# Patient Record
Sex: Female | Born: 1950 | Race: White | Hispanic: No | Marital: Single | Smoking: Former smoker
Health system: Southern US, Community
[De-identification: ages and names within clinical notes are randomized; demographics above are authoritative.]

## PROBLEM LIST (undated history)

## (undated) DIAGNOSIS — J449 Chronic obstructive pulmonary disease, unspecified: Secondary | ICD-10-CM

## (undated) DIAGNOSIS — I739 Peripheral vascular disease, unspecified: Secondary | ICD-10-CM

## (undated) DIAGNOSIS — Z955 Presence of coronary angioplasty implant and graft: Secondary | ICD-10-CM

## (undated) HISTORY — PX: CARDIAC SURGERY: SHX584

---

## 2019-01-30 ENCOUNTER — Emergency Department (HOSPITAL_COMMUNITY): Payer: Medicare Other

## 2019-01-30 ENCOUNTER — Inpatient Hospital Stay (HOSPITAL_COMMUNITY): Payer: Medicare Other | Admitting: Anesthesiology

## 2019-01-30 ENCOUNTER — Inpatient Hospital Stay (HOSPITAL_COMMUNITY): Payer: Medicare Other

## 2019-01-30 ENCOUNTER — Other Ambulatory Visit: Payer: Self-pay

## 2019-01-30 ENCOUNTER — Encounter (HOSPITAL_COMMUNITY)
Admission: EM | Disposition: A | Discharge status: Home Health | Payer: Self-pay | Source: Home / Self Care | Attending: Internal Medicine

## 2019-01-30 ENCOUNTER — Inpatient Hospital Stay (HOSPITAL_COMMUNITY)
Admission: EM | Admit: 2019-01-30 | Discharge: 2019-02-03 | DRG: 493 | Disposition: A | Discharge status: Home Health | Payer: Medicare Other | Attending: Internal Medicine | Admitting: Internal Medicine

## 2019-01-30 ENCOUNTER — Encounter (HOSPITAL_COMMUNITY): Payer: Self-pay | Admitting: Emergency Medicine

## 2019-01-30 DIAGNOSIS — J449 Chronic obstructive pulmonary disease, unspecified: Secondary | ICD-10-CM | POA: Diagnosis present

## 2019-01-30 DIAGNOSIS — G4733 Obstructive sleep apnea (adult) (pediatric): Secondary | ICD-10-CM

## 2019-01-30 DIAGNOSIS — Z7983 Long term (current) use of bisphosphonates: Secondary | ICD-10-CM

## 2019-01-30 DIAGNOSIS — S82841A Displaced bimalleolar fracture of right lower leg, initial encounter for closed fracture: Secondary | ICD-10-CM

## 2019-01-30 DIAGNOSIS — K5090 Crohn's disease, unspecified, without complications: Secondary | ICD-10-CM | POA: Diagnosis present

## 2019-01-30 DIAGNOSIS — Z20828 Contact with and (suspected) exposure to other viral communicable diseases: Secondary | ICD-10-CM | POA: Diagnosis present

## 2019-01-30 DIAGNOSIS — I119 Hypertensive heart disease without heart failure: Secondary | ICD-10-CM | POA: Diagnosis present

## 2019-01-30 DIAGNOSIS — Z7901 Long term (current) use of anticoagulants: Secondary | ICD-10-CM

## 2019-01-30 DIAGNOSIS — K50919 Crohn's disease, unspecified, with unspecified complications: Secondary | ICD-10-CM

## 2019-01-30 DIAGNOSIS — Z79899 Other long term (current) drug therapy: Secondary | ICD-10-CM

## 2019-01-30 DIAGNOSIS — J439 Emphysema, unspecified: Secondary | ICD-10-CM | POA: Diagnosis not present

## 2019-01-30 DIAGNOSIS — Z9989 Dependence on other enabling machines and devices: Secondary | ICD-10-CM | POA: Diagnosis not present

## 2019-01-30 DIAGNOSIS — Z955 Presence of coronary angioplasty implant and graft: Secondary | ICD-10-CM | POA: Diagnosis not present

## 2019-01-30 DIAGNOSIS — Z87828 Personal history of other (healed) physical injury and trauma: Secondary | ICD-10-CM | POA: Diagnosis not present

## 2019-01-30 DIAGNOSIS — K219 Gastro-esophageal reflux disease without esophagitis: Secondary | ICD-10-CM

## 2019-01-30 DIAGNOSIS — Y92009 Unspecified place in unspecified non-institutional (private) residence as the place of occurrence of the external cause: Secondary | ICD-10-CM

## 2019-01-30 DIAGNOSIS — Z95828 Presence of other vascular implants and grafts: Secondary | ICD-10-CM

## 2019-01-30 DIAGNOSIS — I2510 Atherosclerotic heart disease of native coronary artery without angina pectoris: Secondary | ICD-10-CM

## 2019-01-30 DIAGNOSIS — Z419 Encounter for procedure for purposes other than remedying health state, unspecified: Secondary | ICD-10-CM

## 2019-01-30 DIAGNOSIS — Z8673 Personal history of transient ischemic attack (TIA), and cerebral infarction without residual deficits: Secondary | ICD-10-CM | POA: Diagnosis not present

## 2019-01-30 DIAGNOSIS — W109XXA Fall (on) (from) unspecified stairs and steps, initial encounter: Secondary | ICD-10-CM | POA: Diagnosis present

## 2019-01-30 DIAGNOSIS — S82891A Other fracture of right lower leg, initial encounter for closed fracture: Secondary | ICD-10-CM | POA: Diagnosis not present

## 2019-01-30 DIAGNOSIS — E785 Hyperlipidemia, unspecified: Secondary | ICD-10-CM | POA: Diagnosis present

## 2019-01-30 DIAGNOSIS — I639 Cerebral infarction, unspecified: Secondary | ICD-10-CM | POA: Diagnosis not present

## 2019-01-30 DIAGNOSIS — Z7902 Long term (current) use of antithrombotics/antiplatelets: Secondary | ICD-10-CM

## 2019-01-30 DIAGNOSIS — I739 Peripheral vascular disease, unspecified: Secondary | ICD-10-CM

## 2019-01-30 DIAGNOSIS — M25571 Pain in right ankle and joints of right foot: Secondary | ICD-10-CM | POA: Diagnosis present

## 2019-01-30 DIAGNOSIS — S82851A Displaced trimalleolar fracture of right lower leg, initial encounter for closed fracture: Principal | ICD-10-CM | POA: Diagnosis present

## 2019-01-30 DIAGNOSIS — I252 Old myocardial infarction: Secondary | ICD-10-CM | POA: Diagnosis not present

## 2019-01-30 HISTORY — DX: Peripheral vascular disease, unspecified: I73.9

## 2019-01-30 HISTORY — DX: Chronic obstructive pulmonary disease, unspecified: J44.9

## 2019-01-30 HISTORY — PX: ORIF ANKLE FRACTURE: SHX5408

## 2019-01-30 HISTORY — DX: Presence of coronary angioplasty implant and graft: Z95.5

## 2019-01-30 LAB — CBC WITH DIFFERENTIAL/PLATELET
Abs Immature Granulocytes: 0.03 10*3/uL (ref 0.00–0.07)
Basophils Absolute: 0 10*3/uL (ref 0.0–0.1)
Basophils Relative: 1 %
Eosinophils Absolute: 0.1 10*3/uL (ref 0.0–0.5)
Eosinophils Relative: 1 %
HCT: 34.1 % — ABNORMAL LOW (ref 36.0–46.0)
Hemoglobin: 10.6 g/dL — ABNORMAL LOW (ref 12.0–15.0)
Immature Granulocytes: 1 %
Lymphocytes Relative: 11 %
Lymphs Abs: 0.6 10*3/uL — ABNORMAL LOW (ref 0.7–4.0)
MCH: 32.8 pg (ref 26.0–34.0)
MCHC: 31.1 g/dL (ref 30.0–36.0)
MCV: 105.6 fL — ABNORMAL HIGH (ref 80.0–100.0)
Monocytes Absolute: 0.5 10*3/uL (ref 0.1–1.0)
Monocytes Relative: 8 %
Neutro Abs: 4.3 10*3/uL (ref 1.7–7.7)
Neutrophils Relative %: 78 %
Platelets: 202 10*3/uL (ref 150–400)
RBC: 3.23 MIL/uL — ABNORMAL LOW (ref 3.87–5.11)
RDW: 14.6 % (ref 11.5–15.5)
WBC: 5.5 10*3/uL (ref 4.0–10.5)
nRBC: 0 % (ref 0.0–0.2)

## 2019-01-30 LAB — BASIC METABOLIC PANEL
Anion gap: 9 (ref 5–15)
BUN: 15 mg/dL (ref 8–23)
CO2: 22 mmol/L (ref 22–32)
Calcium: 8.2 mg/dL — ABNORMAL LOW (ref 8.9–10.3)
Chloride: 113 mmol/L — ABNORMAL HIGH (ref 98–111)
Creatinine, Ser: 0.73 mg/dL (ref 0.44–1.00)
GFR calc Af Amer: >60 mL/min (ref >60)
GFR calc non Af Amer: >60 mL/min (ref >60)
Glucose, Bld: 93 mg/dL (ref 70–99)
Potassium: 3.9 mmol/L (ref 3.5–5.1)
Sodium: 144 mmol/L (ref 135–145)

## 2019-01-30 LAB — SURGICAL PCR SCREEN
MRSA, PCR: NEGATIVE
Staphylococcus aureus: NEGATIVE

## 2019-01-30 LAB — SARS CORONAVIRUS 2 BY RT PCR (HOSPITAL ORDER, PERFORMED IN ~~LOC~~ HOSPITAL LAB): SARS Coronavirus 2: NEGATIVE

## 2019-01-30 SURGERY — OPEN REDUCTION INTERNAL FIXATION (ORIF) ANKLE FRACTURE
Anesthesia: General | Site: Ankle | Laterality: Right

## 2019-01-30 MED ORDER — SODIUM CHLORIDE 0.9 % IR SOLN
Status: DC | PRN
  Administered 2019-01-30: 1000 mL

## 2019-01-30 MED ORDER — ONDANSETRON HCL 4 MG/2ML IJ SOLN
4.0000 mg | Freq: Four times a day (QID) | INTRAMUSCULAR | Status: DC | PRN
  Administered 2019-02-01: 4 mg via INTRAVENOUS
  Filled 2019-01-30: qty 2

## 2019-01-30 MED ORDER — OXYCODONE HCL 5 MG/5ML PO SOLN: 5.0000 mg | Freq: Once | ORAL | Status: DC | PRN

## 2019-01-30 MED ORDER — ONDANSETRON HCL 4 MG PO TABS: 4.0000 mg | ORAL_TABLET | Freq: Four times a day (QID) | ORAL | Status: DC | PRN

## 2019-01-30 MED ORDER — CLOPIDOGREL BISULFATE 75 MG PO TABS
75.0000 mg | ORAL_TABLET | Freq: Every day | ORAL | Status: DC
  Administered 2019-01-31 – 2019-02-03 (×4): 75 mg via ORAL
  Filled 2019-01-30 (×4): qty 1

## 2019-01-30 MED ORDER — CEFAZOLIN SODIUM-DEXTROSE 2-4 GM/100ML-% IV SOLN
2.0000 g | Freq: Four times a day (QID) | INTRAVENOUS | Status: AC
  Administered 2019-01-31 (×2): 2 g via INTRAVENOUS
  Filled 2019-01-30 (×2): qty 100

## 2019-01-30 MED ORDER — HYDROCODONE-ACETAMINOPHEN 7.5-325 MG PO TABS
1.0000 | ORAL_TABLET | Freq: Four times a day (QID) | ORAL | Status: DC | PRN
  Administered 2019-01-31 (×3): 1 via ORAL
  Filled 2019-01-30 (×4): qty 1

## 2019-01-30 MED ORDER — MIDAZOLAM HCL 2 MG/2ML IJ SOLN
INTRAMUSCULAR | Status: AC
  Filled 2019-01-30: qty 2

## 2019-01-30 MED ORDER — ONDANSETRON HCL 4 MG/2ML IJ SOLN: 4.0000 mg | Freq: Four times a day (QID) | INTRAMUSCULAR | Status: DC | PRN

## 2019-01-30 MED ORDER — TRAMADOL HCL 50 MG PO TABS: 50.0000 mg | ORAL_TABLET | Freq: Four times a day (QID) | ORAL | 0 refills | Status: DC | PRN

## 2019-01-30 MED ORDER — MENTHOL 3 MG MT LOZG: 1.0000 | LOZENGE | OROMUCOSAL | Status: DC | PRN

## 2019-01-30 MED ORDER — ONDANSETRON HCL 4 MG/2ML IJ SOLN
INTRAMUSCULAR | Status: DC | PRN
  Administered 2019-01-30: 4 mg via INTRAVENOUS

## 2019-01-30 MED ORDER — POLYETHYLENE GLYCOL 3350 17 G PO PACK: 17.0000 g | PACK | Freq: Every day | ORAL | Status: DC | PRN

## 2019-01-30 MED ORDER — ONDANSETRON HCL 4 MG/2ML IJ SOLN
4.0000 mg | Freq: Once | INTRAMUSCULAR | Status: AC
  Administered 2019-01-30: 4 mg via INTRAVENOUS
  Filled 2019-01-30: qty 2

## 2019-01-30 MED ORDER — PANTOPRAZOLE SODIUM 40 MG PO TBEC
40.0000 mg | DELAYED_RELEASE_TABLET | Freq: Two times a day (BID) | ORAL | Status: DC
  Administered 2019-01-31 – 2019-02-03 (×7): 40 mg via ORAL
  Filled 2019-01-30 (×7): qty 1

## 2019-01-30 MED ORDER — PROPOFOL 10 MG/ML IV BOLUS
INTRAVENOUS | Status: AC
  Filled 2019-01-30: qty 20

## 2019-01-30 MED ORDER — DEXAMETHASONE SODIUM PHOSPHATE 10 MG/ML IJ SOLN
INTRAMUSCULAR | Status: DC | PRN
  Administered 2019-01-30: 5 mg via INTRAVENOUS

## 2019-01-30 MED ORDER — MAGNESIUM CITRATE PO SOLN: 1.0000 | Freq: Once | ORAL | Status: DC | PRN

## 2019-01-30 MED ORDER — CHLORHEXIDINE GLUCONATE 4 % EX LIQD
60.0000 mL | Freq: Once | CUTANEOUS | Status: AC
  Administered 2019-01-30: 4 via TOPICAL

## 2019-01-30 MED ORDER — BISACODYL 10 MG RE SUPP: 10.0000 mg | Freq: Every day | RECTAL | Status: DC | PRN

## 2019-01-30 MED ORDER — FENTANYL CITRATE (PF) 100 MCG/2ML IJ SOLN
50.0000 ug | INTRAMUSCULAR | Status: AC
  Administered 2019-01-30: 19:00:00 75 ug via INTRAVENOUS

## 2019-01-30 MED ORDER — ROPIVACAINE HCL 5 MG/ML IJ SOLN
INTRAMUSCULAR | Status: DC | PRN
  Administered 2019-01-30: 25 mL via PERINEURAL

## 2019-01-30 MED ORDER — ONDANSETRON 4 MG PO TBDP
4.0000 mg | ORAL_TABLET | Freq: Three times a day (TID) | ORAL | Status: DC | PRN
  Administered 2019-01-31: 4 mg via ORAL
  Filled 2019-01-30: qty 1

## 2019-01-30 MED ORDER — PROPOFOL 10 MG/ML IV BOLUS
0.5000 mg/kg | Freq: Once | INTRAVENOUS | Status: AC
  Administered 2019-01-30: 15:00:00 40 mg via INTRAVENOUS
  Filled 2019-01-30: qty 20

## 2019-01-30 MED ORDER — METOCLOPRAMIDE HCL 5 MG/ML IJ SOLN: 5.0000 mg | Freq: Three times a day (TID) | INTRAMUSCULAR | Status: DC | PRN

## 2019-01-30 MED ORDER — MORPHINE SULFATE (PF) 2 MG/ML IV SOLN
0.5000 mg | INTRAVENOUS | Status: DC | PRN
  Administered 2019-01-31 (×2): 1 mg via INTRAVENOUS
  Filled 2019-01-30 (×2): qty 1

## 2019-01-30 MED ORDER — FENTANYL CITRATE (PF) 100 MCG/2ML IJ SOLN
INTRAMUSCULAR | Status: DC | PRN
  Administered 2019-01-30: 25 ug via INTRAVENOUS

## 2019-01-30 MED ORDER — FENTANYL CITRATE (PF) 100 MCG/2ML IJ SOLN
INTRAMUSCULAR | Status: AC
  Administered 2019-01-30: 75 ug via INTRAVENOUS
  Filled 2019-01-30: qty 2

## 2019-01-30 MED ORDER — ASPIRIN EC 81 MG PO TBEC: 81.0000 mg | DELAYED_RELEASE_TABLET | Freq: Two times a day (BID) | ORAL | 0 refills | Status: DC

## 2019-01-30 MED ORDER — BUDESONIDE 0.5 MG/2ML IN SUSP
0.5000 mg | Freq: Two times a day (BID) | RESPIRATORY_TRACT | Status: DC
  Administered 2019-01-31 – 2019-02-03 (×7): 0.5 mg via RESPIRATORY_TRACT
  Filled 2019-01-30 (×8): qty 2

## 2019-01-30 MED ORDER — PROMETHAZINE HCL 25 MG/ML IJ SOLN: 6.2500 mg | INTRAMUSCULAR | Status: DC | PRN

## 2019-01-30 MED ORDER — PHENYLEPHRINE 40 MCG/ML (10ML) SYRINGE FOR IV PUSH (FOR BLOOD PRESSURE SUPPORT)
PREFILLED_SYRINGE | INTRAVENOUS | Status: AC
  Filled 2019-01-30: qty 30

## 2019-01-30 MED ORDER — FENTANYL CITRATE (PF) 250 MCG/5ML IJ SOLN
INTRAMUSCULAR | Status: AC
  Filled 2019-01-30: qty 5

## 2019-01-30 MED ORDER — ACETAMINOPHEN 325 MG PO TABS
325.0000 mg | ORAL_TABLET | Freq: Four times a day (QID) | ORAL | Status: DC | PRN
  Administered 2019-02-01: 650 mg via ORAL
  Filled 2019-01-30: qty 2

## 2019-01-30 MED ORDER — OXYCODONE HCL 5 MG PO TABS: 5.0000 mg | ORAL_TABLET | Freq: Once | ORAL | Status: DC | PRN

## 2019-01-30 MED ORDER — METOCLOPRAMIDE HCL 5 MG PO TABS: 5.0000 mg | ORAL_TABLET | Freq: Three times a day (TID) | ORAL | Status: DC | PRN

## 2019-01-30 MED ORDER — AZATHIOPRINE 50 MG PO TABS
50.0000 mg | ORAL_TABLET | Freq: Every day | ORAL | Status: DC
  Administered 2019-01-31 – 2019-02-02 (×3): 50 mg via ORAL
  Filled 2019-01-30 (×4): qty 1

## 2019-01-30 MED ORDER — ATORVASTATIN CALCIUM 40 MG PO TABS
80.0000 mg | ORAL_TABLET | Freq: Every day | ORAL | Status: DC
  Administered 2019-01-31 – 2019-02-02 (×3): 80 mg via ORAL
  Filled 2019-01-30 (×3): qty 2

## 2019-01-30 MED ORDER — MORPHINE SULFATE (PF) 4 MG/ML IV SOLN
4.0000 mg | Freq: Once | INTRAVENOUS | Status: AC
  Administered 2019-01-30: 4 mg via INTRAVENOUS
  Filled 2019-01-30: qty 1

## 2019-01-30 MED ORDER — FENTANYL CITRATE (PF) 100 MCG/2ML IJ SOLN
50.0000 ug | Freq: Once | INTRAMUSCULAR | Status: AC
  Administered 2019-01-30: 50 ug via INTRAVENOUS
  Filled 2019-01-30: qty 2

## 2019-01-30 MED ORDER — DOCUSATE SODIUM 100 MG PO CAPS
100.0000 mg | ORAL_CAPSULE | Freq: Two times a day (BID) | ORAL | Status: DC
  Administered 2019-01-30 – 2019-02-03 (×8): 100 mg via ORAL
  Filled 2019-01-30 (×8): qty 1

## 2019-01-30 MED ORDER — MIDAZOLAM HCL 5 MG/5ML IJ SOLN
INTRAMUSCULAR | Status: DC | PRN
  Administered 2019-01-30: 0.5 mg via INTRAVENOUS

## 2019-01-30 MED ORDER — CEFAZOLIN SODIUM-DEXTROSE 2-4 GM/100ML-% IV SOLN
2.0000 g | INTRAVENOUS | Status: AC
  Administered 2019-01-30: 2 g via INTRAVENOUS
  Filled 2019-01-30: qty 100

## 2019-01-30 MED ORDER — LIDOCAINE-EPINEPHRINE 2 %-1:100000 IJ SOLN
INTRAMUSCULAR | Status: DC | PRN
  Administered 2019-01-30: 10 mL via PERINEURAL

## 2019-01-30 MED ORDER — ASPIRIN EC 81 MG PO TBEC
81.0000 mg | DELAYED_RELEASE_TABLET | Freq: Two times a day (BID) | ORAL | Status: DC
  Administered 2019-01-30 – 2019-02-03 (×8): 81 mg via ORAL
  Filled 2019-01-30 (×8): qty 1

## 2019-01-30 MED ORDER — CLONIDINE HCL (ANALGESIA) 100 MCG/ML EP SOLN
EPIDURAL | Status: DC | PRN
  Administered 2019-01-30: 70 ug

## 2019-01-30 MED ORDER — PROPOFOL 10 MG/ML IV BOLUS
INTRAVENOUS | Status: DC | PRN
  Administered 2019-01-30: 80 mg via INTRAVENOUS

## 2019-01-30 MED ORDER — DEXAMETHASONE SODIUM PHOSPHATE 10 MG/ML IJ SOLN
INTRAMUSCULAR | Status: AC
  Filled 2019-01-30: qty 1

## 2019-01-30 MED ORDER — ACETAMINOPHEN 10 MG/ML IV SOLN: 1000.0000 mg | Freq: Once | INTRAVENOUS | Status: DC | PRN

## 2019-01-30 MED ORDER — LACTATED RINGERS IV SOLN
INTRAVENOUS | Status: DC
  Administered 2019-01-30 – 2019-02-01 (×3): via INTRAVENOUS

## 2019-01-30 MED ORDER — TRAMADOL HCL 50 MG PO TABS
50.0000 mg | ORAL_TABLET | Freq: Four times a day (QID) | ORAL | Status: DC | PRN
  Administered 2019-01-31 (×2): 50 mg via ORAL
  Filled 2019-01-30 (×3): qty 1

## 2019-01-30 MED ORDER — METOPROLOL TARTRATE 12.5 MG HALF TABLET
12.5000 mg | ORAL_TABLET | Freq: Two times a day (BID) | ORAL | Status: DC
  Administered 2019-01-31 – 2019-02-03 (×7): 12.5 mg via ORAL
  Filled 2019-01-30 (×7): qty 1

## 2019-01-30 MED ORDER — IPRATROPIUM-ALBUTEROL 0.5-2.5 (3) MG/3ML IN SOLN: 3.0000 mL | Freq: Four times a day (QID) | RESPIRATORY_TRACT | Status: DC | PRN

## 2019-01-30 MED ORDER — PHENOL 1.4 % MT LIQD
1.0000 | OROMUCOSAL | Status: DC | PRN
  Filled 2019-01-30: qty 177

## 2019-01-30 MED ORDER — LIDOCAINE 2% (20 MG/ML) 5 ML SYRINGE
INTRAMUSCULAR | Status: DC | PRN
  Administered 2019-01-30: 80 mg via INTRAVENOUS

## 2019-01-30 MED ORDER — ONDANSETRON HCL 4 MG/2ML IJ SOLN
INTRAMUSCULAR | Status: AC
  Filled 2019-01-30: qty 2

## 2019-01-30 MED ORDER — MORPHINE SULFATE (PF) 2 MG/ML IV SOLN: 2.0000 mg | INTRAVENOUS | Status: DC | PRN

## 2019-01-30 MED ORDER — MIDAZOLAM HCL 2 MG/2ML IJ SOLN: 1.0000 mg | INTRAMUSCULAR | Status: DC

## 2019-01-30 MED ORDER — FENTANYL CITRATE (PF) 100 MCG/2ML IJ SOLN: 25.0000 ug | INTRAMUSCULAR | Status: DC | PRN

## 2019-01-30 MED ORDER — EPHEDRINE SULFATE-NACL 50-0.9 MG/10ML-% IV SOSY
PREFILLED_SYRINGE | INTRAVENOUS | Status: DC | PRN
  Administered 2019-01-30: 10 mg via INTRAVENOUS

## 2019-01-30 SURGICAL SUPPLY — 49 items
BAG ZIPLOCK 12X15 (MISCELLANEOUS) ×2 IMPLANT
BIT DRILL 2.5X2.75 QC CALB (BIT) ×2 IMPLANT
BIT DRILL 2.9 CANN QC NONSTRL (BIT) ×2 IMPLANT
BNDG ELASTIC 6X10 VLCR STRL LF (GAUZE/BANDAGES/DRESSINGS) ×2 IMPLANT
BNDG GAUZE ELAST 4 BULKY (GAUZE/BANDAGES/DRESSINGS) ×2 IMPLANT
COVER SURGICAL LIGHT HANDLE (MISCELLANEOUS) ×2 IMPLANT
COVER WAND RF STERILE (DRAPES) IMPLANT
CUFF TOURN SGL QUICK 34 (TOURNIQUET CUFF) ×1
CUFF TRNQT CYL 34X4.125X (TOURNIQUET CUFF) ×1 IMPLANT
DRAPE C-ARM 42X120 X-RAY (DRAPES) ×2 IMPLANT
DRAPE U-SHAPE 47X51 STRL (DRAPES) ×2 IMPLANT
DRSG PAD ABDOMINAL 8X10 ST (GAUZE/BANDAGES/DRESSINGS) ×2 IMPLANT
DURAPREP 26ML APPLICATOR (WOUND CARE) ×2 IMPLANT
ELECT REM PT RETURN 15FT ADLT (MISCELLANEOUS) ×2 IMPLANT
GAUZE SPONGE 4X4 12PLY STRL (GAUZE/BANDAGES/DRESSINGS) ×2 IMPLANT
GAUZE XEROFORM 5X9 LF (GAUZE/BANDAGES/DRESSINGS) ×2 IMPLANT
GLOVE ORTHO TXT STRL SZ7.5 (GLOVE) ×2 IMPLANT
GLOVE SURG ORTHO 8.5 STRL (GLOVE) ×2 IMPLANT
GOWN STRL REUS W/TWL LRG LVL3 (GOWN DISPOSABLE) ×4 IMPLANT
K-WIRE ACE 1.6X6 (WIRE) ×6
KIT TURNOVER KIT A (KITS) IMPLANT
KWIRE ACE 1.6X6 (WIRE) ×3 IMPLANT
MANIFOLD NEPTUNE II (INSTRUMENTS) ×2 IMPLANT
PACK ORTHO EXTREMITY (CUSTOM PROCEDURE TRAY) ×2 IMPLANT
PAD CAST 4YDX4 CTTN HI CHSV (CAST SUPPLIES) ×1 IMPLANT
PADDING CAST COTTON 4X4 STRL (CAST SUPPLIES) ×1
PADDING CAST COTTON 6X4 STRL (CAST SUPPLIES) ×2 IMPLANT
PLATE TUB 100DEG 5 HO (Plate) ×2 IMPLANT
PROTECTOR NERVE ULNAR (MISCELLANEOUS) ×2 IMPLANT
SCREW ACE CAN 4.0 44M (Screw) ×2 IMPLANT
SCREW ACE CAN 4.0 50M (Screw) ×2 IMPLANT
SCREW CORTICAL 3.5MM  16MM (Screw) ×2 IMPLANT
SCREW CORTICAL 3.5MM  20MM (Screw) ×1 IMPLANT
SCREW CORTICAL 3.5MM 16MM (Screw) ×2 IMPLANT
SCREW CORTICAL 3.5MM 18MM (Screw) ×2 IMPLANT
SCREW CORTICAL 3.5MM 20MM (Screw) ×1 IMPLANT
SCREW CORTICAL 3.5MM 26MM (Screw) ×2 IMPLANT
SCREW NLOCK CANC HEX 4X18 (Screw) ×1 IMPLANT
SCREW NLOCK CANC HEX 4X18 FIB (Screw) ×1 IMPLANT
SCREW NLOCK CANC HEX 4X22 (Screw) ×2 IMPLANT
SPLINT FIBERGLASS 5X30 (CAST SUPPLIES) ×2 IMPLANT
SPONGE LAP 4X18 RFD (DISPOSABLE) ×2 IMPLANT
STAPLER VISISTAT 35W (STAPLE) ×2 IMPLANT
SUT ETHILON 4 0 PS 2 18 (SUTURE) ×2 IMPLANT
SUT VIC AB 1 CT1 27 (SUTURE)
SUT VIC AB 1 CT1 27XBRD ANTBC (SUTURE) IMPLANT
SUT VIC AB 2-0 CT1 27 (SUTURE) ×2
SUT VIC AB 2-0 CT1 TAPERPNT 27 (SUTURE) ×2 IMPLANT
TOWEL OR 17X26 10 PK STRL BLUE (TOWEL DISPOSABLE) ×4 IMPLANT

## 2019-01-30 NOTE — ED Notes (Signed)
XR at bedside

## 2019-01-30 NOTE — ED Notes (Signed)
Unsuccessful IV attempt x2.  

## 2019-01-30 NOTE — Brief Op Note (Signed)
01/30/2019  9:43 PM  PATIENT:  Laura [name]  68 y.o. female  PRE-OPERATIVE DIAGNOSIS:  Displaced trimalleolar fracture/dislocation right ankle  POST-OPERATIVE DIAGNOSIS: Displaced trimalleolar fracture/dislocation right ankle  PROCEDURE:  Procedure(s): OPEN REDUCTION INTERNAL FIXATION (ORIF) ANKLE FRACTURE (Right)  SURGEON:  Surgeon(s) and Role:    Netta Cedars, MD - Primary  PHYSICIAN ASSISTANT:   ASSISTANTS: Ventura Bruns, PA-C   ANESTHESIA:   general  EBL:  10 mL   BLOOD ADMINISTERED:none  DRAINS: none   LOCAL MEDICATIONS USED:  NONE  SPECIMEN:  No Specimen  DISPOSITION OF SPECIMEN:  N/A  COUNTS:  YES  TOURNIQUET:   Total Tourniquet Time Documented: Thigh (Right) - 83 minutes Total: Thigh (Right) - 83 minutes   DICTATION: .Other Dictation: Dictation Number [id number]  PLAN OF CARE: Admit to inpatient   PATIENT DISPOSITION:  PACU - hemodynamically stable.   Delay start of Pharmacological VTE agent (>24hrs) due to surgical blood loss or risk of bleeding: no

## 2019-01-30 NOTE — Anesthesia Procedure Notes (Signed)
Anesthesia Regional Block: Adductor canal block   Pre-Anesthetic Checklist: ,, timeout performed, Correct Patient, Correct Site, Correct Laterality, Correct Procedure, Correct Position, site marked, Risks and benefits discussed,  Surgical consent,  Pre-op evaluation,  At surgeon's request and post-op pain management  Laterality: Right  Prep: chloraprep       Needles:  Injection technique: Single-shot  Needle Type: Echogenic Needle     Needle Length: 9cm      Additional Needles:   Procedures:,,,, ultrasound used (permanent image in chart),,,,  Narrative:  Start time: 01/30/2019 7:08 PM End time: 01/30/2019 7:12 PM Injection made incrementally with aspirations every 5 mL.  Performed by: Personally  Anesthesiologist: Myrtie Soman, MD  Additional Notes: Patient tolerated the procedure well without complications

## 2019-01-30 NOTE — ED Notes (Signed)
Respiratory and ortho called to bedside for procedural sedation.

## 2019-01-30 NOTE — Anesthesia Procedure Notes (Signed)
Anesthesia Procedure Image    

## 2019-01-30 NOTE — Anesthesia Procedure Notes (Signed)
Procedure Name: LMA Insertion Performed by: Gean Maidens, CRNA Pre-anesthesia Checklist: Patient identified and Emergency Drugs available Patient Re-evaluated:Patient Re-evaluated prior to induction Oxygen Delivery Method: Circle system utilized Preoxygenation: Pre-oxygenation with 100% oxygen Induction Type: IV induction Ventilation: Mask ventilation without difficulty LMA: LMA inserted LMA Size: 4.0 Number of attempts: 1 Placement Confirmation: positive ETCO2 and breath sounds checked- equal and bilateral Tube secured with: Tape Dental Injury: Teeth and Oropharynx as per pre-operative assessment

## 2019-01-30 NOTE — Consult Note (Signed)
Reason for Consult:Right ankle injury Referring Physician: EDP  Laura [name] is an 68 y.o. female.  HPI: 68 yo female with multiple medical problems who presents after injuring her right ankle after having a mechanical fall today down stairs in the home of her niece.  The patient is from the [place] area. She reports severe right ankle pain and deformity.  She presented to the [facility] ED for evaluation and treatment.  NO other complaints.  Patient is normally an independent Hydrographic surveyor. She does have significant vascular disease with a history of coronary stents and an iliac stent.  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 She has poor balance at baseline due to a "bad left foot" that has had prior surgery.   Past Medical History:  Diagnosis Date  . COPD (chronic obstructive pulmonary disease) ([place])   . PAD (peripheral artery disease) ([place])   . Stented coronary artery     Past Surgical History:  Procedure Laterality Date  . CARDIAC SURGERY      No family history on file.  Social History:  has no history on file for tobacco, alcohol, and drug.  Allergies: No Known Allergies  Medications: I have reviewed the patient's current medications.  Results for orders placed or performed during the hospital encounter of 01/30/19 (from the past 48 hour(s))  Basic metabolic panel     Status: Abnormal   Collection Time: 01/30/19 12:47 PM  Result Value Ref Range   Sodium 144 135 - 145 mmol/L   Potassium 3.9 3.5 - 5.1 mmol/L   Chloride 113 (H) 98 - 111 mmol/L   CO2 22 22 - 32 mmol/L   Glucose, Bld 93 70 - 99 mg/dL   BUN 15 8 - 23 mg/dL   Creatinine, Ser 0.73 0.44 - 1.00 mg/dL   Calcium 8.2 (L) 8.9 - 10.3 mg/dL   GFR calc non Af Amer >60 >60 mL/min   GFR calc Af Amer >60 >60 mL/min   Anion gap 9 5 - 15    Comment: Performed at [facility], [place] [address] [place], [place] [address]  CBC with Differential      Status: Abnormal   Collection Time: 01/30/19 12:47 PM  Result Value Ref Range   WBC 5.5 4.0 - 10.5 K/uL   RBC 3.23 (L) 3.87 - 5.11 MIL/uL   Hemoglobin 10.6 (L) 12.0 - 15.0 g/dL   HCT 34.1 (L) 36.0 - 46.0 %   MCV 105.6 (H) 80.0 - 100.0 fL   MCH 32.8 26.0 - 34.0 pg   MCHC 31.1 30.0 - 36.0 g/dL   RDW 14.6 11.5 - 15.5 %   Platelets 202 150 - 400 K/uL   nRBC 0.0 0.0 - 0.2 %   Neutrophils Relative % 78 %   Neutro Abs 4.3 1.7 - 7.7 K/uL   Lymphocytes Relative 11 %   Lymphs Abs 0.6 (L) 0.7 - 4.0 K/uL   Monocytes Relative 8 %   Monocytes Absolute 0.5 0.1 - 1.0 K/uL   Eosinophils Relative 1 %   Eosinophils Absolute 0.1 0.0 - 0.5 K/uL   Basophils Relative 1 %   Basophils Absolute 0.0 0.0 - 0.1 K/uL   Immature Granulocytes 1 %   Abs Immature Granulocytes 0.03 0.00 - 0.07 K/uL    Comment: Performed at [facility], [place] [address], [place], [place] [address]  SARS Coronavirus 2 [facility] order, Performed in [facility] hospital lab) Nasopharyngeal  Nasopharyngeal Swab     Status: None   Collection Time: 01/30/19  1:27 PM   Specimen: Nasopharyngeal Swab  Result Value Ref Range   SARS Coronavirus 2 NEGATIVE NEGATIVE    Comment: (NOTE) If result is NEGATIVE SARS-CoV-2 target nucleic acids are NOT DETECTED. The SARS-CoV-2 RNA is generally detectable in upper and lower  respiratory specimens during the acute phase of infection. The lowest  concentration of SARS-CoV-2 viral copies this assay can detect is 250  copies / mL. A negative result does not preclude SARS-CoV-2 infection  and should not be used as the sole basis for treatment or other  patient management decisions.  A negative result may occur with  improper specimen collection / handling, submission of specimen other  than nasopharyngeal swab, presence of viral mutation(s) within the  areas targeted by this assay, and inadequate number of viral copies  (<250 copies / mL). A negative result must be combined with  clinical  observations, patient history, and epidemiological information. If result is POSITIVE SARS-CoV-2 target nucleic acids are DETECTED. The SARS-CoV-2 RNA is generally detectable in upper and lower  respiratory specimens dur ing the acute phase of infection.  Positive  results are indicative of active infection with SARS-CoV-2.  Clinical  correlation with patient history and other diagnostic information is  necessary to determine patient infection status.  Positive results do  not rule out bacterial infection or co-infection with other viruses. If result is PRESUMPTIVE POSTIVE SARS-CoV-2 nucleic acids MAY BE PRESENT.   A presumptive positive result was obtained on the submitted specimen  and confirmed on repeat testing.  While 2019 novel coronavirus  (SARS-CoV-2) nucleic acids may be present in the submitted sample  additional confirmatory testing may be necessary for epidemiological  and / or clinical management purposes  to differentiate between  SARS-CoV-2 and other Sarbecovirus currently known to infect humans.  If clinically indicated additional testing with an alternate test  methodology [phone]) is advised. The SARS-CoV-2 RNA is generally  detectable in upper and lower respiratory sp ecimens during the acute  phase of infection. The expected result is Negative. Fact Sheet for Patients:  BoilerBrush.com.cy Fact Sheet for Healthcare Providers: https://pope.com/ This test is not yet approved or cleared by the [place] FDA and has been authorized for detection and/or diagnosis of SARS-CoV-2 by FDA under an Emergency Use Authorization (EUA).  This EUA will remain in effect (meaning this test can be used) for the duration of the COVID-19 declaration under Section 564(b)(1) of the Act, 21 U.S.C. section 360bbb-3(b)(1), unless the authorization is terminated or revoked sooner. Performed at [facility],  [address] [place], [place] [address]     Dg Ankle Complete Right  Result Date: 01/30/2019 CLINICAL DATA:  Fall down stairs today with right ankle injury. EXAM: RIGHT ANKLE - COMPLETE 3+ VIEW COMPARISON:  None. FINDINGS: Examination demonstrates moderate anteromedial subluxation of the tibia on the talus. There is a displaced transverse fracture of the medial malleolus. There is a displaced oblique fracture of the distal fibular metaphysis. Possible fracture along the posterolateral aspect of the distal tibia. Remainder the exam is unremarkable. IMPRESSION: Moderate subluxation anteromedially of the tibia on the talus. Displaced medial malleolar fracture and distal fibular fracture. Possible fracture along the posterolateral aspect of the distal tibia. Electronically Signed   By: Elberta Fortis M.D.   On: 01/30/2019 12:42   Ct Head Wo Contrast  Result Date: 01/30/2019 CLINICAL DATA:  Head injury after falling down stairs  today. EXAM: CT HEAD WITHOUT CONTRAST CT CERVICAL SPINE WITHOUT CONTRAST TECHNIQUE: Multidetector CT imaging of the head and cervical spine was performed following the standard protocol without intravenous contrast. Multiplanar CT image reconstructions of the cervical spine were also generated. COMPARISON:  None. 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 Old left occipital infarction is noted. Mild chronic ischemic white matter disease is noted. No mass effect or midline shift is noted. Ventricular size is within normal limits. There is no evidence of mass lesion, hemorrhage or acute infarction. Vascular: No hyperdense vessel or unexpected calcification. Skull: Normal. Negative for fracture or focal lesion. Sinuses/Orbits: No acute finding. Large metallic density is seen in the soft tissues anterior to the left globe of unknown etiology. Other: None. CT CERVICAL SPINE FINDINGS Alignment: Minimal grade 1 anterolisthesis of C4-5 is noted  secondary to posterior facet joint hypertrophy. Skull base and vertebrae: No acute fracture. No primary bone lesion or focal pathologic process. Soft tissues and spinal canal: No prevertebral fluid or swelling. No visible canal hematoma. Disc levels: Severe degenerative disc disease is noted at C5-6 and C6-7 with anterior posterior osteophyte formation. Upper chest: Negative. Other: There appears to be fusion of the posterior elements of C2, C3 and C4. Significant degenerative changes seen involving the left-sided posterior facet joint of C4-5. 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 No acute intracranial abnormality seen. Large metallic density is seen in the soft tissues anterior to the left lobe of unknown etiology; clinical correlation is recommended. Severe multilevel degenerative disc disease. No acute abnormality seen in the cervical spine. Electronically Signed   By: Lupita RaiderJames  Green Jr M.D.   On: 01/30/2019 12:32   Ct Cervical Spine Wo Contrast  Result Date: 01/30/2019 CLINICAL DATA:  Head injury after falling down stairs today. EXAM: CT HEAD WITHOUT CONTRAST CT CERVICAL SPINE WITHOUT CONTRAST TECHNIQUE: Multidetector CT imaging of the head and cervical spine was performed following the standard protocol without intravenous contrast. Multiplanar CT image reconstructions of the cervical spine were also generated. COMPARISON:  None. 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 Old left occipital infarction is noted. Mild chronic ischemic white matter disease is noted. No mass effect or midline shift is noted. Ventricular size is within normal limits. There is no evidence of mass lesion, hemorrhage or acute infarction. Vascular: No hyperdense vessel or unexpected calcification. Skull: Normal. Negative for fracture or focal lesion. Sinuses/Orbits: No acute finding. Large metallic density is seen  in the soft tissues anterior to the left globe of unknown etiology. Other: None. CT CERVICAL SPINE FINDINGS Alignment: Minimal grade 1 anterolisthesis of C4-5 is noted secondary to posterior facet joint hypertrophy. Skull base and vertebrae: No acute fracture. No primary bone lesion or focal pathologic process. Soft tissues and spinal canal: No prevertebral fluid or swelling. No visible canal hematoma. Disc levels: Severe degenerative disc disease is noted at C5-6 and C6-7 with anterior posterior osteophyte formation. Upper chest: Negative. Other: There appears to be fusion of the posterior elements of C2, C3 and C4. Significant degenerative changes seen involving the left-sided posterior facet joint of C4-5. 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 No acute intracranial abnormality seen. Large metallic density is seen in the soft tissues anterior to the left lobe of unknown etiology; clinical correlation is recommended. Severe multilevel degenerative disc disease. No acute abnormality seen in the cervical  spine. Electronically Signed   By: Lupita Raider M.D.   On: 01/30/2019 12:32    ROS Blood pressure (!) 130/58, pulse 61, temperature 97.8 F (36.6 C), temperature source Oral, resp. rate 20, height 5\' 9"  (1.753 m), weight 81.6 kg, SpO2 98 %. Physical Exam AAO, NAD, neck non tender, T and L spine nontender Chest non tender with normal chest rise, heart regular Bilateral UEs with no deformity and normal ROM and strength Sensation intact bilateral UE and LEs  Right LE splinted, closed and NVI. Wiggles toes Knee and hip ROM pain free Left LE, pain free AROM No swelling and no tenderness   Assessment/Plan: Displaced trimalleolar ankle fracture/dislocation.  Closed reduction performed by EDP. By report her swelling is not that bad. This represents an unstable fracture pattern.  After discussion with the patient and her niece  the patient would like to have her ankle treated here surgically if cleared medically. Will need Internal Medicine clearance prior to surgery given her vascular and cardiac history. EKG pending Labs look good,  01/30/2019, 3:14 PM

## 2019-01-30 NOTE — ED Notes (Signed)
ED TO INPATIENT HANDOFF REPORT  Name/Age/Gender Laura GuerinBarbara [name] 68 y.o. female  Code Status   Home/SNF/Other Home  Chief Complaint fall right ankle injury  Level of Care/Admitting Diagnosis ED Disposition    ED Disposition Condition Comment   Admit  [facility] [[id number]]  Level of Care: Med-Surg [16]  Covid Evaluation: Confirmed COVID Negative  Diagnosis: Closed displaced bimalleolar fracture of right ankle [[id number]][[id number]]  Admitting Physician: Uz[place]USTRIA, ERIC J [[id number]]  Attending Physician: [place]A, ERIC J [[id number]]  Estimated length of stay: past midnight tomorrow  Certification:: I certify this patient will need inpatient services for at least 2 midnights  PT Class (Do Not Modify): Inpatient [101]  PT Acc Code (Do Not Modify): Private [1]       Medical History Past Medical History:  Diagnosis Date  . COPD (chronic obstructive pulmonary disease) (HCC)   . PAD (peripheral artery disease) (HCC)   . Stented coronary artery     Allergies No Known Allergies  IV Location/Drains/Wounds Patient Lines/Drains/Airways Status   Active Line/Drains/Airways    Name:   Placement date:   Placement time:   Site:   Days:   Peripheral IV 01/30/19 Left Forearm   01/30/19    1243    Forearm   less than 1          Labs/Imaging Results for orders placed or performed during the hospital encounter of 01/30/19 (from the past 48 hour(s))  Basic metabolic panel     Status: Abnormal   Collection Time: 01/30/19 12:47 PM  Result Value Ref Range   Sodium 144 135 - 145 mmol/L   Potassium 3.9 3.5 - 5.1 mmol/L   Chloride 113 (H) 98 - 111 mmol/L   CO2 22 22 - 32 mmol/L   Glucose, Bld 93 70 - 99 mg/dL   BUN 15 8 - 23 mg/dL   Creatinine, Ser 8.460.73 0.44 - 1.00 mg/dL   Calcium 8.2 (L) 8.9 - 10.3 mg/dL   GFR calc non Af Amer >60 >60 mL/min   GFR calc Af Amer >60 >60 mL/min   Anion gap 9 5 - 15    Comment: Performed at [facility], [address] [place], [place] [address]  CBC with Differential     Status: Abnormal   Collection Time: 01/30/19 12:47 PM  Result Value Ref Range   WBC 5.5 4.0 - 10.5 K/uL   RBC 3.23 (L) 3.87 - 5.11 MIL/uL   Hemoglobin 10.6 (L) 12.0 - 15.0 g/dL   HCT 28.434.1 (L) 13.236.0 - 44.046.0 %   MCV 105.6 (H) 80.0 - 100.0 fL   MCH 32.8 26.0 - 34.0 pg   MCHC 31.1 30.0 - 36.0 g/dL   RDW 10.214.6 72.511.5 - 36.615.5 %   Platelets 202 150 - 400 K/uL   nRBC 0.0 0.0 - 0.2 %   Neutrophils Relative % 78 %   Neutro Abs 4.3 1.7 - 7.7 K/uL   Lymphocytes Relative 11 %   Lymphs Abs 0.6 (L) 0.7 - 4.0 K/uL   Monocytes Relative 8 %   Monocytes Absolute 0.5 0.1 - 1.0 K/uL   Eosinophils Relative 1 %   Eosinophils Absolute 0.1 0.0 - 0.5 K/uL   Basophils Relative 1 %   Basophils Absolute 0.0 0.0 - 0.1 K/uL   Immature Granulocytes 1 %   Abs Immature Granulocytes 0.03 0.00 - 0.07 K/uL    Comment: Performed at [facility], [address] [place], [place] [address]  SARS Coronavirus 2 [facility] order, Performed in [facility] hospital lab) Nasopharyngeal Nasopharyngeal Swab     Status: None   Collection Time: 01/30/19  1:27 PM   Specimen: Nasopharyngeal Swab  Result Value Ref Range   SARS Coronavirus 2 NEGATIVE NEGATIVE    Comment: (NOTE) If result is NEGATIVE SARS-CoV-2 target nucleic acids are NOT DETECTED. The SARS-CoV-2 RNA is generally detectable in upper and lower  respiratory specimens during the acute phase of infection. The lowest  concentration of SARS-CoV-2 viral copies this assay can detect is 250  copies / mL. A negative result does not preclude SARS-CoV-2 infection  and should not be used as the sole basis for treatment or other  patient management decisions.  A negative result may occur with  improper specimen collection / handling, submission of specimen other  than nasopharyngeal swab, presence of viral mutation(s) within the  areas targeted by this assay, and inadequate number of viral copies   (<250 copies / mL). A negative result must be combined with clinical  observations, patient history, and epidemiological information. If result is POSITIVE SARS-CoV-2 target nucleic acids are DETECTED. The SARS-CoV-2 RNA is generally detectable in upper and lower  respiratory specimens dur ing the acute phase of infection.  Positive  results are indicative of active infection with SARS-CoV-2.  Clinical  correlation with patient history and other diagnostic information is  necessary to determine patient infection status.  Positive results do  not rule out bacterial infection or co-infection with other viruses. If result is PRESUMPTIVE POSTIVE SARS-CoV-2 nucleic acids MAY BE PRESENT.   A presumptive positive result was obtained on the submitted specimen  and confirmed on repeat testing.  While 2019 novel coronavirus  (SARS-CoV-2) nucleic acids may be present in the submitted sample  additional confirmatory testing may be necessary for epidemiological  and / or clinical management purposes  to differentiate between  SARS-CoV-2 and other Sarbecovirus currently known to infect humans.  If clinically indicated additional testing with an alternate test  methodology [phone]) is advised. The SARS-CoV-2 RNA is generally  detectable in upper and lower respiratory sp ecimens during the acute  phase of infection. The expected result is Negative. Fact Sheet for Patients:  BoilerBrush.com.cy Fact Sheet for Healthcare Providers: https://pope.com/ This test is not yet approved or cleared by the [place] FDA and has been authorized for detection and/or diagnosis of SARS-CoV-2 by FDA under an Emergency Use Authorization (EUA).  This EUA will remain in effect (meaning this test can be used) for the duration of the COVID-19 declaration under Section 564(b)(1) of the Act, 21 U.S.C. section 360bbb-3(b)(1), unless the authorization is terminated  or revoked sooner. Performed at [facility], [address] [place], [place] [address]    Dg Ankle 2 Views Right  Result Date: 01/30/2019 CLINICAL DATA:  Post reduction. EXAM: RIGHT ANKLE - 2 VIEW COMPARISON:  Right ankle x-rays from same day. FINDINGS: Interval reduction of the bimalleolar fracture. No residual tibiotalar subluxation. The medial malleolar fracture is anatomic in alignment. There is some residual posterior displacement of the distal fibular fracture. The posterior malleolus is not well evaluated due to overlapping structures and cast material. The talar dome appears intact. Joint spaces are preserved. Osteopenia. Diffuse soft tissue swelling about the ankle. IMPRESSION: Successful reduction of the bimalleolar ankle fracture subluxation. Electronically Signed   By: Obie Dredge M.D.   On: 01/30/2019 15:50   Dg Ankle Complete Right  Result Date: 01/30/2019 CLINICAL DATA:  Fall down  stairs today with right ankle injury. EXAM: RIGHT ANKLE - COMPLETE 3+ VIEW COMPARISON:  None. FINDINGS: Examination demonstrates moderate anteromedial subluxation of the tibia on the talus. There is a displaced transverse fracture of the medial malleolus. There is a displaced oblique fracture of the distal fibular metaphysis. Possible fracture along the posterolateral aspect of the distal tibia. Remainder the exam is unremarkable. IMPRESSION: Moderate subluxation anteromedially of the tibia on the talus. Displaced medial malleolar fracture and distal fibular fracture. Possible fracture along the posterolateral aspect of the distal tibia. Electronically Signed   By: Marin Olp M.D.   On: 01/30/2019 12:42   Ct Head Wo Contrast  Result Date: 01/30/2019 CLINICAL DATA:  Head injury after falling down stairs today. EXAM: CT HEAD WITHOUT CONTRAST CT CERVICAL SPINE WITHOUT CONTRAST TECHNIQUE: Multidetector CT imaging of the head and cervical spine was performed following the standard  protocol without intravenous contrast. Multiplanar CT image reconstructions of the cervical spine were also generated. COMPARISON:  None. 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 Old left occipital infarction is noted. Mild chronic ischemic white matter disease is noted. No mass effect or midline shift is noted. Ventricular size is within normal limits. There is no evidence of mass lesion, hemorrhage or acute infarction. Vascular: No hyperdense vessel or unexpected calcification. Skull: Normal. Negative for fracture or focal lesion. Sinuses/Orbits: No acute finding. Large metallic density is seen in the soft tissues anterior to the left globe of unknown etiology. Other: None. CT CERVICAL SPINE FINDINGS Alignment: Minimal grade 1 anterolisthesis of C4-5 is noted secondary to posterior facet joint hypertrophy. Skull base and vertebrae: No acute fracture. No primary bone lesion or focal pathologic process. Soft tissues and spinal canal: No prevertebral fluid or swelling. No visible canal hematoma. Disc levels: Severe degenerative disc disease is noted at C5-6 and C6-7 with anterior posterior osteophyte formation. Upper chest: Negative. Other: There appears to be fusion of the posterior elements of C2, C3 and C4. Significant degenerative changes seen involving the left-sided posterior facet joint of C4-5. 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 No acute intracranial abnormality seen. Large metallic density is seen in the soft tissues anterior to the left lobe of unknown etiology; clinical correlation is recommended. Severe multilevel degenerative disc disease. No acute abnormality seen in the cervical spine. Electronically Signed   By: Marijo Conception M.D.   On: 01/30/2019 12:32   Ct Cervical Spine Wo Contrast  Result Date: 01/30/2019 CLINICAL DATA:  Head injury after falling down stairs today.  EXAM: CT HEAD WITHOUT CONTRAST CT CERVICAL SPINE WITHOUT CONTRAST TECHNIQUE: Multidetector CT imaging of the head and cervical spine was performed following the standard protocol without intravenous contrast. Multiplanar CT image reconstructions of the cervical spine were also generated. COMPARISON:  None. 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 Old left occipital infarction is noted. Mild chronic ischemic white matter disease is noted. No mass effect or midline shift is noted. Ventricular size is within normal limits. There is no evidence of mass lesion, hemorrhage or acute infarction. Vascular: No hyperdense vessel or unexpected calcification. Skull: Normal. Negative for fracture or focal lesion. Sinuses/Orbits: No acute finding. Large metallic density is seen in the soft tissues anterior to the left globe of unknown etiology. Other: None. CT CERVICAL SPINE FINDINGS Alignment: Minimal grade 1 anterolisthesis of C4-5 is noted secondary to posterior facet joint hypertrophy. Skull base and vertebrae:  No acute fracture. No primary bone lesion or focal pathologic process. Soft tissues and spinal canal: No prevertebral fluid or swelling. No visible canal hematoma. Disc levels: Severe degenerative disc disease is noted at C5-6 and C6-7 with anterior posterior osteophyte formation. Upper chest: Negative. Other: There appears to be fusion of the posterior elements of C2, C3 and C4. Significant degenerative changes seen involving the left-sided posterior facet joint of C4-5. 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 No acute intracranial abnormality seen. Large metallic density is seen in the soft tissues anterior to the left lobe of unknown etiology; clinical correlation is recommended. Severe multilevel degenerative disc disease. No acute abnormality seen in the cervical spine. Electronically Signed   By:  Lupita Raider M.D.   On: 01/30/2019 12:32   Dg Chest Portable 1 View  Result Date: 01/30/2019 CLINICAL DATA:  Preop. EXAM: PORTABLE CHEST 1 VIEW COMPARISON:  None. FINDINGS: Mild cardiomegaly is noted. Old bilateral rib fractures are noted. No pneumothorax is noted. Mild bibasilar atelectasis is noted. Small left pleural effusion may be present. IMPRESSION: Mild bibasilar subsegmental atelectasis. Small left pleural effusion. Electronically Signed   By: Lupita Raider M.D.   On: 01/30/2019 15:57    Pending Labs [employer] (From admission, onward)    Start     Ordered   Signed and Held  VITAMIN D 25 Hydroxy (Vit-D Deficiency, Fractures)  Add-on,   R     Signed and Held   Signed and Held  Basic metabolic panel  Tomorrow morning,   R     Signed and Held   Signed and Held  CBC  Tomorrow morning,   R     Signed and Held   Signed and Held  HIV Antibody (routine testing w rflx)  (HIV Antibody (Routine testing w reflex) panel)  Once,   R     Signed and Held   Signed and Held  HIV4GL Save Tube  (HIV Antibody (Routine testing w reflex) panel)  Once,   R     Signed and Held          Vitals/Pain Today's Vitals   01/30/19 1500 01/30/19 1515 01/30/19 1530 01/30/19 1615  BP: 130/66 131/77 135/63 136/69  Pulse: (!) 58 (!) 58 (!) 58 (!) 57  Resp: 18 18 20 16   Temp:      TempSrc:      SpO2: 100% 100% 99% 100%  Weight:      Height:      PainSc:        Isolation Precautions Airborne and Contact precautions  Medications Medications  morphine 4 MG/ML injection 4 mg (4 mg Intravenous Given 01/30/19 1249)  ondansetron (ZOFRAN) injection 4 mg (4 mg Intravenous Given 01/30/19 1249)  fentaNYL (SUBLIMAZE) injection 50 mcg (50 mcg Intravenous Given 01/30/19 1353)  propofol (DIPRIVAN) 10 mg/mL bolus/IV push 40.8 mg (40 mg Intravenous Given 01/30/19 1433)    Mobility walks

## 2019-01-30 NOTE — Progress Notes (Signed)
Assisted Dr. Rose with right, ultrasound guided, ankle block. Side rails up, monitors on throughout procedure. See vital signs in flow sheet. Tolerated Procedure well. 

## 2019-01-30 NOTE — Anesthesia Procedure Notes (Signed)
Anesthesia Regional Block: Popliteal block   Pre-Anesthetic Checklist: ,, timeout performed, Correct Patient, Correct Site, Correct Laterality, Correct Procedure, Correct Position, site marked, Risks and benefits discussed,  Surgical consent,  Pre-op evaluation,  At surgeon's request and post-op pain management  Laterality: Right  Prep: chloraprep       Needles:  Injection technique: Single-shot  Needle Type: Echogenic Needle     Needle Length: 9cm      Additional Needles:   Procedures:,,,, ultrasound used (permanent image in chart),,,,  Narrative:  Start time: 01/30/2019 7:00 PM End time: 01/30/2019 7:06 PM Injection made incrementally with aspirations every 5 mL.  Performed by: Personally  Anesthesiologist: Myrtie Soman, MD  Additional Notes: Patient tolerated the procedure well without complications

## 2019-01-30 NOTE — ED Notes (Signed)
Patient transported to CT 

## 2019-01-30 NOTE — Transfer of Care (Signed)
Immediate Anesthesia Transfer of Care Note  Patient: Laura [name]  Procedure(s) Performed: OPEN REDUCTION INTERNAL FIXATION (ORIF) ANKLE FRACTURE (Right Ankle)  Patient Location: PACU  Anesthesia Type:General  Level of Consciousness: awake, alert  and oriented  Airway & Oxygen Therapy: Patient Spontanous Breathing and Patient connected to face mask oxygen  Post-op Assessment: Report given to RN and Post -op Vital signs reviewed and stable  Post vital signs: Reviewed and stable  Last Vitals:  Vitals Value Taken Time  BP 139/66 01/30/19 2133  Temp    Pulse 67 01/30/19 2134  Resp 17 01/30/19 2134  SpO2 96 % 01/30/19 2134  Vitals shown include unvalidated device data.  Last Pain:  Vitals:   01/30/19 1915  TempSrc:   PainSc: 1       Patients Stated Pain Goal: 4 (94/85/46 2703)  Complications: No apparent anesthesia complications

## 2019-01-30 NOTE — Sedation Documentation (Signed)
Attempting reduction at this time 

## 2019-01-30 NOTE — Anesthesia Preprocedure Evaluation (Signed)
Anesthesia Evaluation  Patient identified by MRN, date of birth, ID band Patient awake    Reviewed: Allergy & Precautions, NPO status , Patient's Chart, lab work & pertinent test results  Airway Mallampati: II  TM Distance: >3 FB Neck ROM: Full    Dental no notable dental hx.    Pulmonary sleep apnea, Continuous Positive Airway Pressure Ventilation and Oxygen sleep apnea , COPD,    Pulmonary exam normal breath sounds clear to auscultation       Cardiovascular + CAD and + Cardiac Stents  Normal cardiovascular exam Rhythm:Regular Rate:Normal     Neuro/Psych negative neurological ROS  negative psych ROS   GI/Hepatic negative GI ROS, Neg liver ROS,   Endo/Other  negative endocrine ROS  Renal/GU negative Renal ROS  negative genitourinary   Musculoskeletal negative musculoskeletal ROS (+)   Abdominal   Peds negative pediatric ROS (+)  Hematology Anticoagulated on plavix   Anesthesia Other Findings   Reproductive/Obstetrics negative OB ROS                             Anesthesia Physical Anesthesia Plan  ASA: III  Anesthesia Plan: General   Post-op Pain Management:  Regional for Post-op pain   Induction: Intravenous  PONV Risk Score and Plan: 3 and Ondansetron, Dexamethasone and Treatment may vary due to age or medical condition  Airway Management Planned: LMA  Additional Equipment:   Intra-op Plan:   Post-operative Plan: Extubation in OR  Informed Consent: I have reviewed the patients History and Physical, chart, labs and discussed the procedure including the risks, benefits and alternatives for the proposed anesthesia with the patient or authorized representative who has indicated his/her understanding and acceptance.     Dental advisory given  Plan Discussed with: CRNA and Surgeon  Anesthesia Plan Comments:         Anesthesia Quick Evaluation

## 2019-01-30 NOTE — ED Triage Notes (Signed)
Per EMS, patient from family members home, reports tripping downstairs today, hitting head on table, deformity to right ankle. Takes Plavix. Denies LOC. Hx COPD. Visiting from out of town.

## 2019-01-30 NOTE — ED Notes (Signed)
Niece at bedside

## 2019-01-30 NOTE — ED Notes (Signed)
Charge RN attempting IV at this time. 

## 2019-01-30 NOTE — Sedation Documentation (Signed)
Splint placed

## 2019-01-30 NOTE — ED Notes (Signed)
ED Provider at bedside. 

## 2019-01-30 NOTE — ED Provider Notes (Signed)
[facility]-EMERGENCY DEPT Provider Note   CSN: [id number] Arrival date & time: 01/30/19  1107     History   Chief Complaint Chief Complaint  Patient presents with   Ankle Injury    HPI Laura [name] is a 68 y.o. female.     The history is provided by the patient. No language interpreter was used.  Ankle Injury     68 year old female brought here via EMS for evaluation of a recent fall.  Patient is visiting her niece and this morning while walking down the steps, she believes she may have misstepped, fell down the stairs, striking her head but denies any loss of consciousness.  She report acute onset of 8 out of 10 sharp throbbing pain about her right ankle, nonradiating but she is unable to bear weight.  She endorsed mild right sided headache and mild neck pain from the impact.  She denies any precipitating symptoms prior to the fall.  She is currently on Eliquis for history of cardiac stent.  She does not complain of any chest pain or trouble breathing no abdominal pain or back pain.  She have not received any specific treatment aside from temporary splinting by EMS to her right ankle.  She does endorse some mild tingling sensation down her right foot.  History reviewed. No pertinent past medical history.  There are no active problems to display for this patient.   History reviewed. No pertinent surgical history.   OB History   No obstetric history on file.      Home Medications    Prior to Admission medications   Not on File    Family History No family history on file.  Social History Social History   Tobacco Use   Smoking status: Not on file  Substance Use Topics   Alcohol use: Not on file   Drug use: Not on file     Allergies   Patient has no known allergies.   Review of Systems Review of Systems  All other systems reviewed and are negative.    Physical Exam Updated Vital Signs BP (!) 143/61    Pulse (!) 58    Temp  97.8 F (36.6 C) (Oral)    Resp (!) 21    Ht 5\' 9"  (1.753 m)    Wt 81.6 kg    SpO2 98%    BMI 26.58 kg/m   Physical Exam Vitals signs and nursing note reviewed.  Constitutional:      General: She is not in acute distress.    Appearance: She is well-developed.  HENT:     Head: Normocephalic and atraumatic.     Comments: No significant scalp tenderness.  No hemotympanum, no septal hematoma, no malocclusion, no midface tenderness, no raccoons eyes, no battle sign. Eyes:     Conjunctiva/sclera: Conjunctivae normal.  Neck:     Musculoskeletal: Neck supple.     Comments: No significant midline spine tenderness crepitus or step-off. Cardiovascular:     Rate and Rhythm: Normal rate and regular rhythm.  Pulmonary:     Effort: Pulmonary effort is normal.     Breath sounds: Normal breath sounds.  Abdominal:     Palpations: Abdomen is soft.     Tenderness: There is no abdominal tenderness.  Musculoskeletal:        General: Deformity (Right ankle: Obvious close deformity about ankle with faint skin tenting to the medial malleoli region with tenderness to palpation and crepitus noted.  Decreased ankle range of  motion secondary to pain.  Dorsalis pedis pulse palpable with brisk cap refill) present.  Skin:    Findings: No rash.  Neurological:     Mental Status: She is alert.      ED Treatments / Results  Labs (all labs ordered are listed, but only abnormal results are displayed) Labs Reviewed  BASIC METABOLIC PANEL - Abnormal; Notable for the following components:      Result Value   Chloride 113 (*)    Calcium 8.2 (*)    All other components within normal limits  CBC WITH DIFFERENTIAL/PLATELET - Abnormal; Notable for the following components:   RBC 3.23 (*)    Hemoglobin 10.6 (*)    HCT 34.1 (*)    MCV 105.6 (*)    Lymphs Abs 0.6 (*)    All other components within normal limits  SARS CORONAVIRUS 2 (HOSPITAL ORDER, [facility])     EKG None  Radiology Dg Ankle Complete Right  Result Date: 01/30/2019 CLINICAL DATA:  Fall down stairs today with right ankle injury. EXAM: RIGHT ANKLE - COMPLETE 3+ VIEW COMPARISON:  None. FINDINGS: Examination demonstrates moderate anteromedial subluxation of the tibia on the talus. There is a displaced transverse fracture of the medial malleolus. There is a displaced oblique fracture of the distal fibular metaphysis. Possible fracture along the posterolateral aspect of the distal tibia. Remainder the exam is unremarkable. IMPRESSION: Moderate subluxation anteromedially of the tibia on the talus. Displaced medial malleolar fracture and distal fibular fracture. Possible fracture along the posterolateral aspect of the distal tibia. Electronically Signed   By: Marin Olp M.D.   On: 01/30/2019 12:42   Ct Head Wo Contrast  Result Date: 01/30/2019 CLINICAL DATA:  Head injury after falling down stairs today. EXAM: CT HEAD WITHOUT CONTRAST CT CERVICAL SPINE WITHOUT CONTRAST TECHNIQUE: Multidetector CT imaging of the head and cervical spine was performed following the standard protocol without intravenous contrast. Multiplanar CT image reconstructions of the cervical spine were also generated. COMPARISON:  None. 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 Old left occipital infarction is noted. Mild chronic ischemic white matter disease is noted. No mass effect or midline shift is noted. Ventricular size is within normal limits. There is no evidence of mass lesion, hemorrhage or acute infarction. Vascular: No hyperdense vessel or unexpected calcification. Skull: Normal. Negative for fracture or focal lesion. Sinuses/Orbits: No acute finding. Large metallic density is seen in the soft tissues anterior to the left globe of unknown etiology. Other: None. CT CERVICAL SPINE FINDINGS Alignment: Minimal grade 1 anterolisthesis of C4-5 is noted secondary to posterior facet  joint hypertrophy. Skull base and vertebrae: No acute fracture. No primary bone lesion or focal pathologic process. Soft tissues and spinal canal: No prevertebral fluid or swelling. No visible canal hematoma. Disc levels: Severe degenerative disc disease is noted at C5-6 and C6-7 with anterior posterior osteophyte formation. Upper chest: Negative. Other: There appears to be fusion of the posterior elements of C2, C3 and C4. Significant degenerative changes seen involving the left-sided posterior facet joint of C4-5. 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 No acute intracranial abnormality seen. Large metallic density is seen in the soft tissues anterior to the left lobe of unknown etiology; clinical correlation is recommended. Severe multilevel degenerative disc disease. No acute abnormality seen in the cervical spine. Electronically Signed   By: Marijo Conception M.D.   On: 01/30/2019  12:32   Ct Cervical Spine Wo Contrast  Result Date: 01/30/2019 CLINICAL DATA:  Head injury after falling down stairs today. EXAM: CT HEAD WITHOUT CONTRAST CT CERVICAL SPINE WITHOUT CONTRAST TECHNIQUE: Multidetector CT imaging of the head and cervical spine was performed following the standard protocol without intravenous contrast. Multiplanar CT image reconstructions of the cervical spine were also generated. COMPARISON:  None. 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 Old left occipital infarction is noted. Mild chronic ischemic white matter disease is noted. No mass effect or midline shift is noted. Ventricular size is within normal limits. There is no evidence of mass lesion, hemorrhage or acute infarction. Vascular: No hyperdense vessel or unexpected calcification. Skull: Normal. Negative for fracture or focal lesion. Sinuses/Orbits: No acute finding. Large metallic density is seen in the soft tissues anterior  to the left globe of unknown etiology. Other: None. CT CERVICAL SPINE FINDINGS Alignment: Minimal grade 1 anterolisthesis of C4-5 is noted secondary to posterior facet joint hypertrophy. Skull base and vertebrae: No acute fracture. No primary bone lesion or focal pathologic process. Soft tissues and spinal canal: No prevertebral fluid or swelling. No visible canal hematoma. Disc levels: Severe degenerative disc disease is noted at C5-6 and C6-7 with anterior posterior osteophyte formation. Upper chest: Negative. Other: There appears to be fusion of the posterior elements of C2, C3 and C4. Significant degenerative changes seen involving the left-sided posterior facet joint of C4-5. 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 No acute intracranial abnormality seen. Large metallic density is seen in the soft tissues anterior to the left lobe of unknown etiology; clinical correlation is recommended. Severe multilevel degenerative disc disease. No acute abnormality seen in the cervical spine. Electronically Signed   By: Lupita RaiderJames  Green Jr M.D.   On: 01/30/2019 12:32    Procedures .Ortho Injury Treatment  Date/Time: 01/30/2019 2:42 PM Performed by: Fayrene Helperran, Amaan Meyer, PA-C Authorized by: Fayrene Helperran, Dewaine Morocho, PA-C   Consent:    Consent obtained:  Verbal and written   Consent given by:  Patient   Risks discussed:  Fracture, nerve damage and recurrent dislocation   Alternatives discussed:  Referral and delayed treatmentInjury location: ankle Location details: right ankle Injury type: fracture-dislocation Fracture type: trimalleolar Pre-procedure distal perfusion: normal Pre-procedure neurological function: diminished Pre-procedure range of motion: reduced  Anesthesia: Local anesthesia used: no  Patient sedated: Yes. Refer to sedation procedure documentation for details of sedation. Manipulation performed: yes Skin traction used: no Skeletal  traction used: no Reduction successful: yes X-ray confirmed reduction: yes Immobilization: splint Splint type: ankle stirrup Supplies used: plaster Post-procedure neurovascular assessment: post-procedure neurovascularly intact Post-procedure distal perfusion: normal Post-procedure neurological function: normal Post-procedure range of motion: improved Patient tolerance: patient tolerated the procedure well with no immediate complications    (including critical care time)  Medications Ordered in ED Medications  morphine 4 MG/ML injection 4 mg (4 mg Intravenous Given 01/30/19 1249)  ondansetron (ZOFRAN) injection 4 mg (4 mg Intravenous Given 01/30/19 1249)  fentaNYL (SUBLIMAZE) injection 50 mcg (50 mcg Intravenous Given 01/30/19 1353)  propofol (DIPRIVAN) 10 mg/mL bolus/IV push 40.8 mg (40 mg Intravenous Given 01/30/19 1433)     Initial Impression / Assessment and Plan / ED Course  I have reviewed the triage vital signs and the nursing notes.  Pertinent labs & imaging results that were available during my care of the patient were reviewed by me and considered in my medical decision making (see chart  for details).        BP (!) 143/61    Pulse (!) 58    Temp 97.8 F (36.6 C) (Oral)    Resp (!) 21    Ht 5\' 9"  (1.753 m)    Wt 81.6 kg    SpO2 98%    BMI 26.58 kg/m    Final Clinical Impressions(s) / ED Diagnoses   Final diagnoses:  Trimalleolar fracture of right ankle, closed, initial encounter    ED Discharge Orders    None     1:27 PM Patient with mechanical fall down the steps and injured her right ankle.  An x-ray of the right ankle demonstrate moderate subluxation anterior medially of the tibia on the talus.  Displaced medial malleolus fracture and distal fibular fracture.  Possible fracture along the posterior lateral aspect of the distal tibia.  This is a closed injury.  Patient is neurovascularly intact.  Will attempt to reduce ankle.  Head CT scan without acute  finding.  However there is a large metallic density seen in the soft tissue anterior to the left lobe of unknown etiology.  On exam, I do not appreciate any foreign body.  Patient reports she has had significant head injury many years ago with cold hardware embedded for assistance with her left eyelid.  I suspect this is corresponding to the abnormal incidental finding on the CT scan.  Care discussed with Dr. Juleen China.   1:48 PM Appreciate consultation from orthopedist DR. Norris, I spoke with his PA Nida Boatman who agrees to see and likely admit pt for further surgical management of her ankle injury.  Plan to reduce ankle and splinted.   2:44 PM Successful R ankle reduction under conscious sedation.  Reduction performed by me under supervision of DR. Kohut.  Pt to be admitted by ortho for surgical ORIF. Pt voice understanding and agrees with plan.   3:08 PM Orthopedist DR. Norris have evaluated pt and f/u on post reduction xray result.  He would like medicine to admit pt for medical clearance with plan to operate on her R ankle tonight.  Will consult for admission.   3:17 PM Appreciate consultation from [facility] who agrees to see and admit pt.     Fayrene Helper, PA-C 01/30/19 1518    Raeford Razor, MD 01/31/19 [phone]

## 2019-01-30 NOTE — H&P (Signed)
History and Physical    Elayne GuerinBarbara Coey ZOX:[id number] DOB: October 01, 1950 DOA: 01/30/2019  PCP: System, Pcp Not In  Patient coming from: Home, Currently traveling from [place]  I have personally briefly reviewed patient's old medical records in [facility]  Chief Complaint: Right ankle pain  HPI: Elayne GuerinBarbara Watterson is a 68 y.o. female with medical history significant of CAD status post PCI/stent, COPD, OSA on CPAP, CVA, Crohn's disease, HLD, GERD who presents following fall with chief complaint of right ankle pain.  Patient was apparently coming down some steps in which she slipped and fell this morning.  She mainly reported severe right ankle discomfort.  She denies syncopal episode and recalls the event in its entirety.  She also reports recurrent nausea, believes it secondary to a medication side effect during the reduction of her ankle dislocation.  No other complaints or concerns at this time.  Specifically denies headache, no fever/chills/night sweats, no vomiting/diarrhea, no chest pain, palpitations, no shortness of breath, no abdominal pain.  ED Course: In the ED, temperature 97.8, HR 61, RR 20, BP 130/58, SPO2 98% on 2 L nasal cannula (uses home oxygen prn).  The BC count 5.5, hemoglobin 10.6, platelet count 202, sodium 144, potassium 3.9, chloride 113, CO2 22, BUN 15, creatinine 0.73, glucose 93.  COVID-19/SARS-CoV-2 test negative.  Right ankle x-ray notable for displaced medial malleolar fracture.  CT head with old right posterior parietal/left occipital infarct, no acute abnormality.  CT C-spine negative for acute fracture/subluxation.  EKG with normal sinus rhythm, QTC 467, HR 58, LVH, no concerning ST elevation/depressions or T wave inversions.  Patient's ankle dislocation was reduced by ED provider.  Orthopedics, Dr. Ranell PatrickNorris with [facility] is consulted and plans operative intervention this evening.  TRH was consulted for admission.  Review of Systems: As per HPI otherwise 10  point review of systems negative.    Past Medical History:  Diagnosis Date   COPD (chronic obstructive pulmonary disease) (HCC)    PAD (peripheral artery disease) (HCC)    Stented coronary artery     Past Surgical History:  Procedure Laterality Date   CARDIAC SURGERY       has no history on file for tobacco, alcohol, and drug.  No Known Allergies  No family history on file.   Prior to Admission medications   Medication Sig Start Date End Date Taking? Authorizing Provider  alendronate (FOSAMAX) 70 MG tablet Take 70 mg by mouth once a week. 01/02/19  Yes [provider]  atorvastatin (LIPITOR) 80 MG tablet Take 80 mg by mouth at bedtime.  01/17/19  Yes [provider]  azaTHIOprine (IMURAN) 50 MG tablet Take 50 mg by mouth daily. 11/23/18  Yes [provider]  clopidogrel (PLAVIX) 75 MG tablet Take 75 mg by mouth daily. 01/02/19  Yes [provider]  ipratropium-albuterol (DUONEB) 0.5-2.5 (3) MG/3ML SOLN Inhale 3 mLs into the lungs every 6 (six) hours as needed for shortness of breath. 08/08/18   [provider]  metoprolol tartrate (LOPRESSOR) 25 MG tablet Take 25 mg by mouth daily. 01/17/19   [provider]  ondansetron (ZOFRAN-ODT) 4 MG disintegrating tablet Take 4 mg by mouth every 4 (four) hours as needed for nausea/vomiting. 11/08/18   [provider]    Physical Exam: Vitals:   01/30/19 1442 01/30/19 1500 01/30/19 1515 01/30/19 1530  BP: (!) 130/58 130/66 131/77 135/63  Pulse: 61 (!) 58 (!) 58 (!) 58  Resp: 20 18 18 20   Temp:  TempSrc:      SpO2: 98% 100% 100% 99%  Weight:      Height:        Constitutional: NAD, calm, comfortable Vitals:   01/30/19 1442 01/30/19 1500 01/30/19 1515 01/30/19 1530  BP: (!) 130/58 130/66 131/77 135/63  Pulse: 61 (!) 58 (!) 58 (!) 58  Resp: 20 18 18 20   Temp:      TempSrc:      SpO2: 98% 100% 100% 99%  Weight:      Height:       Eyes: PERRL, lids and conjunctivae  normal ENMT: Mucous membranes are moist. Posterior pharynx clear of any exudate or lesions.Normal dentition.  Neck: normal, supple, no masses, no thyromegaly Respiratory: clear to auscultation bilaterally, no wheezing, no crackles. Normal respiratory effort. No accessory muscle use.  Currently oxygenating well on 2 L nasal cannula Cardiovascular: Regular rate and rhythm, no murmurs / rubs / gallops. No extremity edema. 2+ pedal pulses. No carotid bruits.  Abdomen: no tenderness, no masses palpated. No hepatosplenomegaly. Bowel sounds positive.  Musculoskeletal: Left lower extremity without edema, normal active/passive range of motion and normal strength.  Right lower extremity currently in splint, patient is neurovascular intact bilaterally Skin: no rashes, lesions, ulcers. No induration Neurologic: CN 2-12 grossly intact. Sensation intact, DTR normal. Strength 5/5 in all 4.  Psychiatric: Normal judgment and insight. Alert and oriented x 3. Normal mood.     Labs on Admission: I have personally reviewed following labs and imaging studies  CBC: Recent Labs  Lab 01/30/19 1247  WBC 5.5  NEUTROABS 4.3  HGB 10.6*  HCT 34.1*  MCV 105.6*  PLT 202   Basic Metabolic Panel: Recent Labs  Lab 01/30/19 1247  NA 144  K 3.9  CL 113*  CO2 22  GLUCOSE 93  BUN 15  CREATININE 0.73  CALCIUM 8.2*   GFR: Estimated Creatinine Clearance: 74.8 mL/min (by C-G formula based on SCr of 0.73 mg/dL). Liver Function Tests: No results for input(s): AST, ALT, ALKPHOS, BILITOT, PROT, ALBUMIN in the last 168 hours. No results for input(s): LIPASE, AMYLASE in the last 168 hours. No results for input(s): AMMONIA in the last 168 hours. Coagulation Profile: No results for input(s): INR, PROTIME in the last 168 hours. Cardiac Enzymes: No results for input(s): CKTOTAL, CKMB, CKMBINDEX, TROPONINI in the last 168 hours. BNP (last 3 results) No results for input(s): PROBNP in the last 8760 hours. HbA1C: No  results for input(s): HGBA1C in the last 72 hours. CBG: No results for input(s): GLUCAP in the last 168 hours. Lipid Profile: No results for input(s): CHOL, HDL, LDLCALC, TRIG, CHOLHDL, LDLDIRECT in the last 72 hours. Thyroid Function Tests: No results for input(s): TSH, T4TOTAL, FREET4, T3FREE, THYROIDAB in the last 72 hours. Anemia Panel: No results for input(s): VITAMINB12, FOLATE, FERRITIN, TIBC, IRON, RETICCTPCT in the last 72 hours. Urine analysis: No results found for: COLORURINE, APPEARANCEUR, LABSPEC, PHURINE, GLUCOSEU, HGBUR, BILIRUBINUR, KETONESUR, PROTEINUR, UROBILINOGEN, NITRITE, LEUKOCYTESUR  Radiological Exams on Admission: Dg Ankle 2 Views Right  Result Date: 01/30/2019 CLINICAL DATA:  Post reduction. EXAM: RIGHT ANKLE - 2 VIEW COMPARISON:  Right ankle x-rays from same day. FINDINGS: Interval reduction of the bimalleolar fracture. No residual tibiotalar subluxation. The medial malleolar fracture is anatomic in alignment. There is some residual posterior displacement of the distal fibular fracture. The posterior malleolus is not well evaluated due to overlapping structures and cast material. The talar dome appears intact. Joint spaces are preserved. Osteopenia. Diffuse soft tissue swelling  about the ankle. IMPRESSION: Successful reduction of the bimalleolar ankle fracture subluxation. Electronically Signed   By: Obie Dredge M.D.   On: 01/30/2019 15:50   Dg Ankle Complete Right  Result Date: 01/30/2019 CLINICAL DATA:  Fall down stairs today with right ankle injury. EXAM: RIGHT ANKLE - COMPLETE 3+ VIEW COMPARISON:  None. FINDINGS: Examination demonstrates moderate anteromedial subluxation of the tibia on the talus. There is a displaced transverse fracture of the medial malleolus. There is a displaced oblique fracture of the distal fibular metaphysis. Possible fracture along the posterolateral aspect of the distal tibia. Remainder the exam is unremarkable. IMPRESSION: Moderate  subluxation anteromedially of the tibia on the talus. Displaced medial malleolar fracture and distal fibular fracture. Possible fracture along the posterolateral aspect of the distal tibia. Electronically Signed   By: Elberta Fortis M.D.   On: 01/30/2019 12:42   Ct Head Wo Contrast  Result Date: 01/30/2019 CLINICAL DATA:  Head injury after falling down stairs today. EXAM: CT HEAD WITHOUT CONTRAST CT CERVICAL SPINE WITHOUT CONTRAST TECHNIQUE: Multidetector CT imaging of the head and cervical spine was performed following the standard protocol without intravenous contrast. Multiplanar CT image reconstructions of the cervical spine were also generated. COMPARISON:  None. 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 Old left occipital infarction is noted. Mild chronic ischemic white matter disease is noted. No mass effect or midline shift is noted. Ventricular size is within normal limits. There is no evidence of mass lesion, hemorrhage or acute infarction. Vascular: No hyperdense vessel or unexpected calcification. Skull: Normal. Negative for fracture or focal lesion. Sinuses/Orbits: No acute finding. Large metallic density is seen in the soft tissues anterior to the left globe of unknown etiology. Other: None. CT CERVICAL SPINE FINDINGS Alignment: Minimal grade 1 anterolisthesis of C4-5 is noted secondary to posterior facet joint hypertrophy. Skull base and vertebrae: No acute fracture. No primary bone lesion or focal pathologic process. Soft tissues and spinal canal: No prevertebral fluid or swelling. No visible canal hematoma. Disc levels: Severe degenerative disc disease is noted at C5-6 and C6-7 with anterior posterior osteophyte formation. Upper chest: Negative. Other: There appears to be fusion of the posterior elements of C2, C3 and C4. Significant degenerative changes seen involving the left-sided posterior facet joint of C4-5. IMPRESSION: Old right posterior parietal  and left occipital infarctions. Mild chronic ischemic white matter disease. No acute intracranial abnormality seen. Large metallic density is seen in the soft tissues anterior to the left lobe of unknown etiology; clinical correlation is recommended. Severe multilevel degenerative disc disease. No acute abnormality seen in the cervical spine. Electronically Signed   By: Lupita Raider M.D.   On: 01/30/2019 12:32   Ct Cervical Spine Wo Contrast  Result Date: 01/30/2019 CLINICAL DATA:  Head injury after falling down stairs today. EXAM: CT HEAD WITHOUT CONTRAST CT CERVICAL SPINE WITHOUT CONTRAST TECHNIQUE: Multidetector CT imaging of the head and cervical spine was performed following the standard protocol without intravenous contrast. Multiplanar CT image reconstructions of the cervical spine were also generated. COMPARISON:  None. 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 Old left occipital infarction is noted. Mild chronic ischemic white matter disease is noted. No mass effect or midline shift is noted. Ventricular size is within normal limits. There is no evidence of mass lesion, hemorrhage or acute infarction. Vascular: No hyperdense vessel or unexpected calcification. Skull: Normal. Negative for fracture or focal lesion. Sinuses/Orbits: No acute finding. Large  metallic density is seen in the soft tissues anterior to the left globe of unknown etiology. Other: None. CT CERVICAL SPINE FINDINGS Alignment: Minimal grade 1 anterolisthesis of C4-5 is noted secondary to posterior facet joint hypertrophy. Skull base and vertebrae: No acute fracture. No primary bone lesion or focal pathologic process. Soft tissues and spinal canal: No prevertebral fluid or swelling. No visible canal hematoma. Disc levels: Severe degenerative disc disease is noted at C5-6 and C6-7 with anterior posterior osteophyte formation. Upper chest: Negative. Other: There appears to be fusion of the  posterior elements of C2, C3 and C4. Significant degenerative changes seen involving the left-sided posterior facet joint of C4-5. 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 No acute intracranial abnormality seen. Large metallic density is seen in the soft tissues anterior to the left lobe of unknown etiology; clinical correlation is recommended. Severe multilevel degenerative disc disease. No acute abnormality seen in the cervical spine. Electronically Signed   By: Lupita Raider M.D.   On: 01/30/2019 12:32   Dg Chest Portable 1 View  Result Date: 01/30/2019 CLINICAL DATA:  Preop. EXAM: PORTABLE CHEST 1 VIEW COMPARISON:  None. FINDINGS: Mild cardiomegaly is noted. Old bilateral rib fractures are noted. No pneumothorax is noted. Mild bibasilar atelectasis is noted. Small left pleural effusion may be present. IMPRESSION: Mild bibasilar subsegmental atelectasis. Small left pleural effusion. Electronically Signed   By: Lupita Raider M.D.   On: 01/30/2019 15:57    EKG: Independently reviewed.  NSR, HR 58, QTc 467, LVH, no concerning T wave inversions or ST elevation/depression.  Assessment/Plan Principal Problem:   Closed displaced bimalleolar fracture of right ankle Active Problems:   COPD (chronic obstructive pulmonary disease) (HCC)   CAD (coronary artery disease)   Crohn's disease (HCC)   OSA on CPAP   HLD (hyperlipidemia)   PVD (peripheral vascular disease) (HCC)   GERD (gastroesophageal reflux disease)  Closed displaced bimalleolar fracture of right ankle Patient presenting with right ankle pain and foot deformity following mechanical fall earlier today.  Denies any loss of consciousness and recalls the event in its entirety.  X-ray right ankle with displaced bimalleolar fracture with subluxation, status post successful reduction by EDP.  Patient has been evaluated by orthopedics with plan for further surgical  intervention this evening. --Continue bedrest --Check vitamin D-25 hydroxy level --Norco 7.5mg  PO q6h prn for moderate pain --Morphine 2 mg IV every 3 hours as needed for severe breakthrough pain --Planned operative management tonight roughly 7 PM per Dr. Ranell Patrick  CAD/MI s/p stent Peripheral vascular disease s/p LLE stent two months prior Essential hypertension --Continue home metoprolol 12.5 mg p.o. twice daily, hold for HR less than 60 --Continue statin, Plavix, and aspirin  Crohn's disease, not in acute exacerbation --Continue Imuran 50 mg p.o. daily, no need to hold for surgical intervention  GERD: Continue PPI  COPD Follows with pulmonary outpatient.  Uses oxygen prn at home. --Continue home nebs with formoterol every 12 hours and Pulmicort twice daily with hospital substitution as needed --Titrate supplemental oxygen to maintain SPO2 greater than 88%  Hx ischemic CVA, other nonhemorrhagic  CT head with notable old right posterior parietal and left occipital infarcts without acute findings. --Continue aspirin and statin  Preoperative risk assessment: Patient with history of Crohn's disease on immunosuppression with Imuran, history of CAD/PVD/MI status post stent, CVA and COPD who presented with acute right ankle fracture.  Patient with no history of diabetes and normal  creatinine.  In accordance with the revised cardiac risk index, patient has a 10% 30-day risk of death, MI or cardiac arrest.  She is currently medically optimized.  Given these risk factors, it is reasonable to proceed with surgical intervention with a low to moderate risk surgical procedure given these findings as benefits outweigh the risks given her severe debility with her acute fracture.  No further work-up necessary at this time.  No need to hold home Imuran.  And patient not on chronic steroids so no need for stress dose steroids perioperatively.   DVT prophylaxis: SCDs to left lower extremity only, holding  chemical DVT prophylaxis in the setting of pending surgical intervention Code Status: Full code Family Communication: Discussed with patient's niece, Suanne Marker who is present at bedside Disposition Plan: To be determined following surgical intervention Consults called: [facility] - Dr. Meda Coffee Admission status: Inpatient, medical floor   Harlem Thresher J [place] DO [facility] Pager [phone]  If 7PM-7AM, please contact night-coverage [email or web address] Password [email or web address]  01/30/2019, 4:07 PM

## 2019-01-30 NOTE — Discharge Instructions (Signed)
Elevate the right leg above the heart when possible.  Wiggle the toes to promote circulation  STRICT Non Weight Bearing on the right LE  Keep the splint clean and dry until follow up with Dr Veverly Fells  Please take 81 mg baby aspirin twice daily for 30 days to prevent blood clots  Wear the knee high support stocking on the left leg to prevent blood clots  Follow up in the office with Dr Veverly Fells in two weeks, please call [phone] for appt

## 2019-01-31 ENCOUNTER — Encounter (HOSPITAL_COMMUNITY): Payer: Self-pay

## 2019-01-31 DIAGNOSIS — S82891A Other fracture of right lower leg, initial encounter for closed fracture: Secondary | ICD-10-CM

## 2019-01-31 LAB — BASIC METABOLIC PANEL
Anion gap: 12 (ref 5–15)
BUN: 12 mg/dL (ref 8–23)
CO2: 23 mmol/L (ref 22–32)
Calcium: 7.8 mg/dL — ABNORMAL LOW (ref 8.9–10.3)
Chloride: 106 mmol/L (ref 98–111)
Creatinine, Ser: 0.61 mg/dL (ref 0.44–1.00)
GFR calc Af Amer: >60 mL/min (ref >60)
GFR calc non Af Amer: >60 mL/min (ref >60)
Glucose, Bld: 117 mg/dL — ABNORMAL HIGH (ref 70–99)
Potassium: 3.7 mmol/L (ref 3.5–5.1)
Sodium: 141 mmol/L (ref 135–145)

## 2019-01-31 LAB — HIV ANTIBODY (ROUTINE TESTING W REFLEX): HIV Screen 4th Generation wRfx: NONREACTIVE

## 2019-01-31 LAB — CBC
HCT: 32.8 % — ABNORMAL LOW (ref 36.0–46.0)
Hemoglobin: 10.2 g/dL — ABNORMAL LOW (ref 12.0–15.0)
MCH: 33 pg (ref 26.0–34.0)
MCHC: 31.1 g/dL (ref 30.0–36.0)
MCV: 106.1 fL — ABNORMAL HIGH (ref 80.0–100.0)
Platelets: 203 10*3/uL (ref 150–400)
RBC: 3.09 MIL/uL — ABNORMAL LOW (ref 3.87–5.11)
RDW: 14.6 % (ref 11.5–15.5)
WBC: 6.3 10*3/uL (ref 4.0–10.5)
nRBC: 0 % (ref 0.0–0.2)

## 2019-01-31 LAB — VITAMIN D 25 HYDROXY (VIT D DEFICIENCY, FRACTURES): Vit D, 25-Hydroxy: 30.98 ng/mL (ref 30–100)

## 2019-01-31 MED ORDER — HYDROMORPHONE HCL 1 MG/ML IJ SOLN: 0.5000 mg | INTRAMUSCULAR | Status: DC | PRN

## 2019-01-31 MED ORDER — OXYCODONE HCL 5 MG PO TABS
5.0000 mg | ORAL_TABLET | ORAL | Status: DC | PRN
  Filled 2019-01-31 (×3): qty 1

## 2019-01-31 MED ORDER — OXYCODONE HCL 5 MG PO TABS
10.0000 mg | ORAL_TABLET | ORAL | Status: DC | PRN
  Administered 2019-01-31 – 2019-02-03 (×12): 10 mg via ORAL
  Filled 2019-01-31 (×11): qty 2

## 2019-01-31 NOTE — TOC Progression Note (Addendum)
Transition of Care [facility] [place]) - Progression Note    Patient Details  Name: Laura [name] MRN: [id number] Date of Birth: May 31, 1950  [facility] CM/SW Contact  Leeroy Cha, RN Phone Number: 01/31/2019, 12:41 PM  Clinical Narrative:     hhc orders faxed to [facility] in [facility]. aTTention to Charlett Blake. tct-to [facility] messafge left for jessie to call back. Information for dme orders faxed to dwayne at [facility] in [place], [place]      Expected Discharge Plan and Services                                                 Social Determinants of Health (SDOH) Interventions    Readmission Risk Interventions No flowsheet data found.

## 2019-01-31 NOTE — Progress Notes (Signed)
Orthopedics Progress Note  Subjective: Patient complaining of ankle pain this AM  Objective:  Vitals:   01/31/19 0135 01/31/19 0536  BP:  140/70  Pulse: 63 67  Resp: 18 18  Temp:  97.8 F (36.6 C)  SpO2: 96% 99%    General: Awake and alert  Musculoskeletal: right ankle in SLS, warm toes, diminished sensation throughout all toes, splint is not on too tight Neurovascularly intact  Lab Results  Component Value Date   WBC 6.3 01/31/2019   HGB 10.2 (L) 01/31/2019   HCT 32.8 (L) 01/31/2019   MCV 106.1 (H) 01/31/2019   PLT 203 01/31/2019       Component Value Date/Time   NA 141 01/31/2019 0224   K 3.7 01/31/2019 0224   CL 106 01/31/2019 0224   CO2 23 01/31/2019 0224   GLUCOSE 117 (H) 01/31/2019 0224   BUN 12 01/31/2019 0224   CREATININE 0.61 01/31/2019 0224   CALCIUM 7.8 (L) 01/31/2019 0224   GFRNONAA >60 01/31/2019 0224   GFRAA >60 01/31/2019 0224    No results found for: INR, PROTIME  Assessment/Plan: POD #1 s/p Procedure(s): OPEN REDUCTION INTERNAL FIXATION (ORIF) ANKLE FRACTURE Strict NWB on the right LE, mobilization with therapy Care Management for home health needs Likely Discharge to her local home tomorrow  Doran Heater. Veverly Fells, MD 01/31/2019 7:33 AM

## 2019-01-31 NOTE — Op Note (Signed)
Laura [name], [name] MEDICAL RECORD [id number] ACCOUNT [id number] DATE OF BIRTH:1951/01/05 FACILITY: [facility] LOCATION: [id number] PHYSICIAN:STEVEN R. Tyre Beaver, MD  OPERATIVE REPORT  DATE OF PROCEDURE:  01/30/2019  PREOPERATIVE DIAGNOSIS:  Displaced right trimalleolar ankle fracture dislocation.  POSTOPERATIVE DIAGNOSIS:  Displaced right trimalleolar ankle fracture dislocation.  PROCEDURE PERFORMED:  Open reduction and internal fixation of displaced right trimalleolar ankle fracture dislocation.  ATTENDING SURGEON:  Malon Kindle, MD  ASSISTANT:  Modesto Charon, [place], who was scrubbed during the entire procedure and necessary for satisfactory completion of surgery.  ANESTHESIA:  General anesthesia was used.  ESTIMATED BLOOD LOSS:  Minimal.  FLUID REPLACEMENT:  1000 mL crystalloid.  INSTRUMENT COUNTS:  Correct.  COMPLICATIONS:  No complications.  ANTIBIOTICS:  Perioperative antibiotics were given.  Tourniquet was utilized 300 mmHg for 1 hour.  INDICATIONS:  The patient is a 68 year old female who presents after a fall down stairs while visiting a relative here in [place].  The patient is from [place].  The patient does report having some balance issues and problems with her left foot.   She complained of immediate severe pain in her right ankle and deformity consistent with an ankle fracture dislocation.  She presented to [facility] emergency department with a closed neurovascularly intact injury and x-rays demonstrated a displaced  trimalleolar ankle fracture.  We discussed with the patient options for management, recommending surgery to restore anatomic alignment of the ankle mortise and stabilize her fracture.  Risks and benefits of surgery were discussed in detail and informed  consent was obtained.  DESCRIPTION OF PROCEDURE:  After an adequate level of anesthesia was achieved, the patient was positioned supine on the operating table.  The right leg was correctly  identified.  Nonsterile tourniquet placed on proximal thigh.  Right leg was sterilely  prepped and draped in the usual manner.  Time-out called, verifying correct patient, correct site, we elevated the limb, exsanguinated using Esmarch bandage and elevated the tourniquet to 300 mmHg.  We performed a longitudinal incision over the medial  malleolus using a 15 blade scalpel.  Dissection down through subcutaneous tissues using tenotomy scissors.  Bovie electrocautery was used to control hemostasis.  We identified the fractured medial malleolus.  We cleaned out the periosteum from inside the  fracture site.  We identified the anterior and posterior borders of the medial malleolus fracture and then anatomically aligned the fracture.  We placed 2 cannulated pins across the fracture site, verified on multiplanar C-arm, which was draped into the  field and then placed 2 cannulated screws of the appropriate length, one 45 mm and the other 50 mm, engaging the posterior cortex of the tibia.  We had excellent compression at the fracture site and anatomic alignment.  We then went to the lateral side.   A longitudinal skin incision over the fibula.  Dissection down through subcutaneous tissues directly to bone.  Subperiosteal dissection.  This was a fibula fracture which was transverse, not amenable to interfragmentary fixation.  We felt that  posterior antiglide plate would be the best treatment for this.  We reduced the fracture, placed a 5-hole one-third tubular plate on the posterior aspect of the fibula and then placed 3 bicortical screws.  The patient's bone was very soft and purchase  was poor.  We had to use a 4.0 cancellous screws distally and a 3.5 cortical more proximally to get the best purchase possible, but we did have good alignment in the antiglide plate was in appropriate position.  Ankle mortise looked  great.  Final  multiplanar x-rays to verify reduction of the posterior malleolus, which it was reduced  perfectly and then we went ahead and closed the wounds with layered closure, 2-0 Vicryl and then staples.  Sterile bandage was applied followed by a short leg splint  with the patient's foot in neutral.  The patient was transported to recovery room in stable condition.  She will be nonweightbearing for 6 weeks.  TN/NUANCE  D:01/30/2019 T:01/31/2019 JOB:[id number]

## 2019-01-31 NOTE — Evaluation (Signed)
Physical Therapy Evaluation Patient Details Name: Laura [name] MRN: [id number] DOB: 07-07-1950 Today's Date: 01/31/2019   History of Present Illness  68 y.o. female with medical history significant of CAD status post PCI/stent, COPD, OSA on CPAP, CVA, Crohn's disease, HLD, GERD and admitted for right ankle fracture.  Pt s/p Right ankle ORIF 01/30/19.  Clinical Impression  Patient is s/p above surgery resulting in functional limitations due to the deficits listed below (see PT Problem List).  Patient will benefit from skilled PT to increase their independence and safety with mobility to allow discharge to the venue listed below.  Pt reports balance deficits at baseline and plans to work on transfers and w/c training in therapy.  Attempted to discuss d/c plan however pt became upset (crying) and asked therapist for PT to step away so pt left with daughter.  Daughter reported pt could d/c to her home, she works from home, and could set up room for pt on main level however she also lives in [place].  Pt reports she is trying to be "realistic" about care at home and feels she needs to d/c to SNF (seems to not want to be a burden on her family as well).  Recommend SNF if pt/family unable to obtain 24/7 care.     Follow Up Recommendations SNF(SNF vs home if 24/7 assist available)    Equipment Recommendations  Wheelchair cushion (measurements PT);Wheelchair (measurements PT);3in1 (PT)    Recommendations for Other Services       Precautions / Restrictions Precautions Precautions: Fall Restrictions Weight Bearing Restrictions: Yes RLE Weight Bearing: Non weight bearing      Mobility  Bed Mobility Overal bed mobility: Needs Assistance Bed Mobility: Supine to Sit     Supine to sit: Min guard;HOB elevated        Transfers Overall transfer level: Needs assistance Equipment used: Rolling walker (2 wheeled) Transfers: Sit to/from [employer] Sit to Stand: Min assist;+2  safety/equipment Stand pivot transfers: Min assist;+2 safety/equipment       General transfer comment: verbal cues for hand placement, pt able to maintain NWB well, however became very anxious/nervous during transfer requiring more assist to stabilize  Ambulation/Gait             General Gait Details: deferred; pt reports she has poor balance at baseline and plans to focus on transfers and w/c training in therapy  Stairs            Wheelchair Mobility    Modified Rankin (Stroke Patients Only)       Balance Overall balance assessment: History of Falls                                           Pertinent Vitals/Pain Pain Assessment: 0-10 Pain Score: 4  Pain Location: R anterior ankle Pain Descriptors / Indicators: Sore;Discomfort Pain Intervention(s): Monitored during session;Repositioned    Home Living Family/patient expects to be discharged to:: Private residence Living Arrangements: Alone Available Help at Discharge: Family Type of Home: House Home Access: Stairs to enter   Secretary/administrator of Steps: 3 Home Layout: Able to live on main level with bedroom/bathroom Home Equipment: None Additional Comments: pt is from [place], here visiting niece; daughter in town and if pt to d/c to home, would go to daughter's home as above; all other homes have flight to bed/bath    Prior Function Level  of Independence: Independent               Hand Dominance        Extremity/Trunk Assessment        Lower Extremity Assessment Lower Extremity Assessment: Generalized weakness;RLE deficits/detail RLE Deficits / Details: R lower leg immobilized, pt unable to wiggle toes, reports numbness in toes       Communication   Communication: No difficulties  Cognition Arousal/Alertness: Awake/alert Behavior During Therapy: WFL for tasks assessed/performed Overall Cognitive Status: Within Functional Limits for tasks assessed                                         General Comments      Exercises     Assessment/Plan    PT Assessment Patient needs continued PT services  PT Problem List Decreased strength;Decreased mobility;Decreased activity tolerance;Decreased balance;Decreased knowledge of use of DME;Decreased knowledge of precautions;Pain       PT Treatment Interventions DME instruction;Therapeutic activities;Gait training;Stair training;Functional mobility training;Patient/family education;Therapeutic exercise;Balance training;Wheelchair mobility training    PT Goals (Current goals can be found in the Care Plan section)  Acute Rehab PT Goals PT Goal Formulation: With patient/family Time For Goal Achievement: 02/07/19 Potential to Achieve Goals: Good    Frequency Min 3X/week   Barriers to discharge        Co-evaluation               AM-PAC PT "6 Clicks" Mobility  Outcome Measure Help needed turning from your back to your side while in a flat bed without using bedrails?: A Little Help needed moving from lying on your back to sitting on the side of a flat bed without using bedrails?: A Little Help needed moving to and from a bed to a chair (including a wheelchair)?: A Lot Help needed standing up from a chair using your arms (e.g., wheelchair or bedside chair)?: A Lot Help needed to walk in hospital room?: Total Help needed climbing 3-5 steps with a railing? : Total 6 Click Score: 12    End of Session Equipment Utilized During Treatment: Gait belt;Oxygen Activity Tolerance: Patient tolerated treatment well Patient left: in chair;with call bell/phone within reach;with chair alarm set;with family/visitor present   PT Visit Diagnosis: Other abnormalities of gait and mobility (R26.89)    Time: 2683-4196 PT Time Calculation (min) (ACUTE ONLY): 19 min   Charges:   PT Evaluation $PT Eval Low Complexity: Sidman, PT, DPT Acute Rehabilitation Services Office:  [phone] Pager: [phone]   Trena Platt 01/31/2019, 12:26 PM

## 2019-01-31 NOTE — Progress Notes (Signed)
PROGRESS NOTE    Laura [name]  WJX:[id number] DOB: 1951-03-23 DOA: 01/30/2019 PCP: System, Pcp Not In   Brief Narrative:  Laura [name] is a 68 y.o. female with medical history significant of CAD status post PCI/stent, COPD, OSA on CPAP, CVA, Crohn's disease, HLD, GERD who presents following fall with chief complaint of right ankle pain.  Patient was apparently coming down some steps in which she slipped and fell this morning.  She mainly reported severe right ankle discomfort.  She denies syncopal episode and recalls the event in its entirety.  She also reports recurrent nausea, believes it secondary to a medication side effect during the reduction of her ankle dislocation.  No other complaints or concerns at this time.  Specifically denies headache, no fever/chills/night sweats, no vomiting/diarrhea, no chest pain, palpitations, no shortness of breath, no abdominal pain.  In the ED, temperature 97.8, HR 61, RR 20, BP 130/58, SPO2 98% on 2 L nasal cannula (uses home oxygen prn).  The BC count 5.5, hemoglobin 10.6, platelet count 202, sodium 144, potassium 3.9, chloride 113, CO2 22, BUN 15, creatinine 0.73, glucose 93.  COVID-19/SARS-CoV-2 test negative. Right ankle x-ray notable for displaced medial malleolar fracture.  CT head with old right posterior parietal/left occipital infarct, no acute abnormality.  CT C-spine negative for acute fracture/subluxation. EKG with normal sinus rhythm, QTC 467, HR 58, LVH, no concerning ST elevation/depressions or T wave inversions.  Patient's ankle dislocation was reduced by ED provider.  Orthopedics, Dr. Ranell Patrick with [facility] consulted and plan operative intervention this evening.  [facility] was consulted for admission.   Assessment & Plan:   Principal Problem:   Closed displaced bimalleolar fracture of right ankle Active Problems:   COPD (chronic obstructive pulmonary disease) (HCC)   CAD (coronary artery disease)   Crohn's disease (HCC)   OSA on  CPAP   HLD (hyperlipidemia)   PVD (peripheral vascular disease) (HCC)   GERD (gastroesophageal reflux disease)   Ankle fracture, right, closed, initial encounter  Closed displaced bimalleolar fracture of right ankle with dislocation Patient presenting with right ankle pain and foot deformity following mechanical fall earlier today.  Denies any loss of consciousness and recalls the event in its entirety.  X-ray right ankle with displaced bimalleolar fracture with subluxation, status post successful reduction by EDP.    Patient underwent successful ORIF of right ankle bimalleolar fracture by Dr. Ranell Patrick on 01/30/2019. --Continue splint, nonweightbearing right lower extremity --Awaiting PT/OT evaluation --Vitamin D-25 hydroxy level 30.98, within normal limits but at low side --Norco 7.5mg  PO q6h prn for moderate pain --Morphine 2 mg IV every 3 hours as needed for severe breakthrough pain --Aspirin 81 mg p.o. twice daily for DVT prophylaxis --Will likely need SNF on discharge, social work for evaluation  CAD/MI s/p stent Peripheral vascular disease s/p LLE stent two months prior Essential hypertension --Continue home metoprolol 12.5 mg p.o. twice daily, hold for HR less than 60 --Continue statin, Plavix, and aspirin  Crohn's disease, not in acute exacerbation --Continue Imuran 50 mg p.o. daily  GERD: Continue PPI  COPD Follows with pulmonary outpatient.  Uses oxygen prn at home. --Continue home nebs with formoterol every 12 hours and Pulmicort twice daily with hospital substitution as needed --Titrate supplemental oxygen to maintain SPO2 greater than 88%  Hx ischemic CVA, other nonhemorrhagic  CT head with notable old right posterior parietal and left occipital infarcts without acute findings. --Continue aspirin and statin   DVT prophylaxis: SCDs to left lower extremity only, aspirin 81  mg p.o. twice daily Code Status: Full code Family Communication: None present at bedside this  morning Disposition Plan: Continue inpatient, awaiting PT/OT consultation, likely will need SNF which patient is in agreement with, social work for coordination.   Consultants:   [facility] - Dr. Ranell Patrick  Procedures:   Reduction of ankle dislocation by ED physician on 01/30/2019  ORIF right bimalleolar ankle fracture 01/30/2019: Dr. Delton See  Antimicrobials:   Perioperative cefazolin   Subjective: Patient seen and examined at bedside, resting comfortably.  Eating breakfast.  States pain is controlled.  Is having some difficulty with moving her toes with slight decrease sensation.  Patient states concern regarding discharge back to her niece's home given that she has stairs and no bathroom on the main level, and she is visiting from out of town.  She prefers SNF placement given nonweightbearing status to her right foot at this point.  No other complaints or concerns at this time.  Denies headache, no fever/chills/night sweats, no nausea some vomiting/diarrhea, no chest pain, no palpitations, no shortness of breath, no abdominal pain, no weakness, no fatigue.  No acute events overnight per nursing staff.  Objective: Vitals:   01/31/19 0135 01/31/19 0536 01/31/19 0737 01/31/19 0830  BP:  140/70  (!) 125/57  Pulse: 63 67  69  Resp: Temp:  97.8 F (36.6 C)  (!) 97.5 F (36.4 C)  TempSrc:  Oral  Oral  SpO2: 96% 99% 93% 94%  Weight:      Height:        Intake/Output Summary (Last 24 hours) at 01/31/2019 1145 Last data filed at 01/31/2019 0926 Gross per 24 hour  Intake 2195 ml  Output 310 ml  Net 1885 ml   Filed Weights   01/30/19 1116  Weight: 81.6 kg    Examination:  General exam: Appears calm and comfortable  Respiratory system: Clear to auscultation. Respiratory effort normal. Cardiovascular system: S1 & S2 heard, RRR. No JVD, murmurs, rubs, gallops or clicks. No pedal edema. Gastrointestinal system: Abdomen is nondistended, soft and nontender. No organomegaly  or masses felt. Normal bowel sounds heard. Central nervous system: Alert and oriented. No focal neurological deficits. Extremities: Right lower extremity with dressing in place/splint, decreased sensation to toes with minimal movement with active ROM, cap refill less than 2 sec Skin: No rashes, lesions or ulcers Psychiatry: Judgement and insight appear normal. Mood & affect appropriate.     Data Reviewed: I have personally reviewed following labs and imaging studies  CBC: Recent Labs  Lab 01/30/19 1247 01/31/19 0224  WBC 5.5 6.3  NEUTROABS 4.3  --   HGB 10.6* 10.2*  HCT 34.1* 32.8*  MCV 105.6* 106.1*  PLT 202 203   Basic Metabolic Panel: Recent Labs  Lab 01/30/19 1247 01/31/19 0224  NA 144 141  K 3.9 3.7  CL 113* 106  CO2 22 23  GLUCOSE 93 117*  BUN 15 12  CREATININE 0.73 0.61  CALCIUM 8.2* 7.8*   GFR: Estimated Creatinine Clearance: 74.8 mL/min (by C-G formula based on SCr of 0.61 mg/dL). Liver Function Tests: No results for input(s): AST, ALT, ALKPHOS, BILITOT, PROT, ALBUMIN in the last 168 hours. No results for input(s): LIPASE, AMYLASE in the last 168 hours. No results for input(s): AMMONIA in the last 168 hours. Coagulation Profile: No results for input(s): INR, PROTIME in the last 168 hours. Cardiac Enzymes: No results for input(s): CKTOTAL, CKMB, CKMBINDEX, TROPONINI in the last 168 hours. BNP (last 3 results)  No results for input(s): PROBNP in the last 8760 hours. HbA1C: No results for input(s): HGBA1C in the last 72 hours. CBG: No results for input(s): GLUCAP in the last 168 hours. Lipid Profile: No results for input(s): CHOL, HDL, LDLCALC, TRIG, CHOLHDL, LDLDIRECT in the last 72 hours. Thyroid Function Tests: No results for input(s): TSH, T4TOTAL, FREET4, T3FREE, THYROIDAB in the last 72 hours. Anemia Panel: No results for input(s): VITAMINB12, FOLATE, FERRITIN, TIBC, IRON, RETICCTPCT in the last 72 hours. Sepsis Labs: No results for input(s):  PROCALCITON, LATICACIDVEN in the last 168 hours.  Recent Results (from the past 240 hour(s))  SARS Coronavirus 2 [facility] order, Performed in [facility] hospital lab) Nasopharyngeal Nasopharyngeal Swab     Status: None   Collection Time: 01/30/19  1:27 PM   Specimen: Nasopharyngeal Swab  Result Value Ref Range Status   SARS Coronavirus 2 NEGATIVE NEGATIVE Final    Comment: (NOTE) If result is NEGATIVE SARS-CoV-2 target nucleic acids are NOT DETECTED. The SARS-CoV-2 RNA is generally detectable in upper and lower  respiratory specimens during the acute phase of infection. The lowest  concentration of SARS-CoV-2 viral copies this assay can detect is 250  copies / mL. A negative result does not preclude SARS-CoV-2 infection  and should not be used as the sole basis for treatment or other  patient management decisions.  A negative result may occur with  improper specimen collection / handling, submission of specimen other  than nasopharyngeal swab, presence of viral mutation(s) within the  areas targeted by this assay, and inadequate number of viral copies  (<250 copies / mL). A negative result must be combined with clinical  observations, patient history, and epidemiological information. If result is POSITIVE SARS-CoV-2 target nucleic acids are DETECTED. The SARS-CoV-2 RNA is generally detectable in upper and lower  respiratory specimens dur ing the acute phase of infection.  Positive  results are indicative of active infection with SARS-CoV-2.  Clinical  correlation with patient history and other diagnostic information is  necessary to determine patient infection status.  Positive results do  not rule out bacterial infection or co-infection with other viruses. If result is PRESUMPTIVE POSTIVE SARS-CoV-2 nucleic acids MAY BE PRESENT.   A presumptive positive result was obtained on the submitted specimen  and confirmed on repeat testing.  While 2019 novel coronavirus  (SARS-CoV-2)  nucleic acids may be present in the submitted sample  additional confirmatory testing may be necessary for epidemiological  and / or clinical management purposes  to differentiate between  SARS-CoV-2 and other Sarbecovirus currently known to infect humans.  If clinically indicated additional testing with an alternate test  methodology [phone]) is advised. The SARS-CoV-2 RNA is generally  detectable in upper and lower respiratory sp ecimens during the acute  phase of infection. The expected result is Negative. Fact Sheet for Patients:  StrictlyIdeas.no Fact Sheet for Healthcare Providers: BankingDealers.co.za This test is not yet approved or cleared by the [place] FDA and has been authorized for detection and/or diagnosis of SARS-CoV-2 by FDA under an Emergency Use Authorization (EUA).  This EUA will remain in effect (meaning this test can be used) for the duration of the COVID-19 declaration under Section 564(b)(1) of the Act, 21 U.S.C. section 360bbb-3(b)(1), unless the authorization is terminated or revoked sooner. Performed at [facility], [place] [address] [place], [place] [address]   Surgical pcr screen     Status: None   Collection Time: 01/30/19  5:42 PM   Specimen: Nasal  Mucosa; Nasal Swab  Result Value Ref Range Status   MRSA, PCR NEGATIVE NEGATIVE Final   Staphylococcus aureus NEGATIVE NEGATIVE Final    Comment: (NOTE) The Xpert SA Assay (FDA approved for NASAL specimens in patients 70 years of age and older), is one component of a comprehensive surveillance program. It is not intended to diagnose infection nor to guide or monitor treatment. Performed at [facility], [address] [place], [place] [address]          Radiology Studies: Dg Ankle 2 Views Right  Result Date: 01/30/2019 CLINICAL DATA:  Post reduction. EXAM: RIGHT ANKLE - 2 VIEW COMPARISON:  Right ankle  x-rays from same day. FINDINGS: Interval reduction of the bimalleolar fracture. No residual tibiotalar subluxation. The medial malleolar fracture is anatomic in alignment. There is some residual posterior displacement of the distal fibular fracture. The posterior malleolus is not well evaluated due to overlapping structures and cast material. The talar dome appears intact. Joint spaces are preserved. Osteopenia. Diffuse soft tissue swelling about the ankle. IMPRESSION: Successful reduction of the bimalleolar ankle fracture subluxation. Electronically Signed   By: Obie DredgeWilliam T Derry M.D.   On: 01/30/2019 15:50   Dg Ankle Complete Right  Result Date: 01/30/2019 CLINICAL DATA:  ORIF of right ankle fracture EXAM: RIGHT ANKLE - COMPLETE 3+ VIEW COMPARISON:  Films from earlier in the same day. FLUOROSCOPY TIME:  Radiation Exposure Index (as provided by the fluoroscopic device): Not available If the device does not provide the exposure index: Fluoroscopy Time:  19 seconds Number of Acquired Images:  6 FINDINGS: Two fixation screws are now seen traversing the medial malleolar fracture. A fixation sideplate is now seen along the distal fibula with multiple fixation screws in place. IMPRESSION: Status post ORIF of the right ankle fractures. Electronically Signed   By: Alcide CleverMark  Lukens M.D.   On: 01/30/2019 21:56   Dg Ankle Complete Right  Result Date: 01/30/2019 CLINICAL DATA:  Fall down stairs today with right ankle injury. EXAM: RIGHT ANKLE - COMPLETE 3+ VIEW COMPARISON:  None. FINDINGS: Examination demonstrates moderate anteromedial subluxation of the tibia on the talus. There is a displaced transverse fracture of the medial malleolus. There is a displaced oblique fracture of the distal fibular metaphysis. Possible fracture along the posterolateral aspect of the distal tibia. Remainder the exam is unremarkable. IMPRESSION: Moderate subluxation anteromedially of the tibia on the talus. Displaced medial malleolar fracture  and distal fibular fracture. Possible fracture along the posterolateral aspect of the distal tibia. Electronically Signed   By: Elberta Fortisaniel  Boyle M.D.   On: 01/30/2019 12:42   Ct Head Wo Contrast  Result Date: 01/30/2019 CLINICAL DATA:  Head injury after falling down stairs today. EXAM: CT HEAD WITHOUT CONTRAST CT CERVICAL SPINE WITHOUT CONTRAST TECHNIQUE: Multidetector CT imaging of the head and cervical spine was performed following the standard protocol without intravenous contrast. Multiplanar CT image reconstructions of the cervical spine were also generated. COMPARISON:  None. 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 Old left occipital infarction is noted. Mild chronic ischemic white matter disease is noted. No mass effect or midline shift is noted. Ventricular size is within normal limits. There is no evidence of mass lesion, hemorrhage or acute infarction. Vascular: No hyperdense vessel or unexpected calcification. Skull: Normal. Negative for fracture or focal lesion. Sinuses/Orbits: No acute finding. Large metallic density is seen in the soft tissues anterior to the left globe of unknown etiology. Other: None. CT CERVICAL SPINE FINDINGS  Alignment: Minimal grade 1 anterolisthesis of C4-5 is noted secondary to posterior facet joint hypertrophy. Skull base and vertebrae: No acute fracture. No primary bone lesion or focal pathologic process. Soft tissues and spinal canal: No prevertebral fluid or swelling. No visible canal hematoma. Disc levels: Severe degenerative disc disease is noted at C5-6 and C6-7 with anterior posterior osteophyte formation. Upper chest: Negative. Other: There appears to be fusion of the posterior elements of C2, C3 and C4. Significant degenerative changes seen involving the left-sided posterior facet joint of C4-5. 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 No acute  intracranial abnormality seen. Large metallic density is seen in the soft tissues anterior to the left lobe of unknown etiology; clinical correlation is recommended. Severe multilevel degenerative disc disease. No acute abnormality seen in the cervical spine. Electronically Signed   By: Lupita Raider M.D.   On: 01/30/2019 12:32   Ct Cervical Spine Wo Contrast  Result Date: 01/30/2019 CLINICAL DATA:  Head injury after falling down stairs today. EXAM: CT HEAD WITHOUT CONTRAST CT CERVICAL SPINE WITHOUT CONTRAST TECHNIQUE: Multidetector CT imaging of the head and cervical spine was performed following the standard protocol without intravenous contrast. Multiplanar CT image reconstructions of the cervical spine were also generated. COMPARISON:  None. 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 Old left occipital infarction is noted. Mild chronic ischemic white matter disease is noted. No mass effect or midline shift is noted. Ventricular size is within normal limits. There is no evidence of mass lesion, hemorrhage or acute infarction. Vascular: No hyperdense vessel or unexpected calcification. Skull: Normal. Negative for fracture or focal lesion. Sinuses/Orbits: No acute finding. Large metallic density is seen in the soft tissues anterior to the left globe of unknown etiology. Other: None. CT CERVICAL SPINE FINDINGS Alignment: Minimal grade 1 anterolisthesis of C4-5 is noted secondary to posterior facet joint hypertrophy. Skull base and vertebrae: No acute fracture. No primary bone lesion or focal pathologic process. Soft tissues and spinal canal: No prevertebral fluid or swelling. No visible canal hematoma. Disc levels: Severe degenerative disc disease is noted at C5-6 and C6-7 with anterior posterior osteophyte formation. Upper chest: Negative. Other: There appears to be fusion of the posterior elements of C2, C3 and C4. Significant degenerative changes seen involving the  left-sided posterior facet joint of C4-5. 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 No acute intracranial abnormality seen. Large metallic density is seen in the soft tissues anterior to the left lobe of unknown etiology; clinical correlation is recommended. Severe multilevel degenerative disc disease. No acute abnormality seen in the cervical spine. Electronically Signed   By: Lupita Raider M.D.   On: 01/30/2019 12:32   Dg Chest Portable 1 View  Result Date: 01/30/2019 CLINICAL DATA:  Preop. EXAM: PORTABLE CHEST 1 VIEW COMPARISON:  None. FINDINGS: Mild cardiomegaly is noted. Old bilateral rib fractures are noted. No pneumothorax is noted. Mild bibasilar atelectasis is noted. Small left pleural effusion may be present. IMPRESSION: Mild bibasilar subsegmental atelectasis. Small left pleural effusion. Electronically Signed   By: Lupita Raider M.D.   On: 01/30/2019 15:57   Dg C-arm 1-60 Min-no Report  Result Date: 01/30/2019 Fluoroscopy was utilized by the requesting physician.  No radiographic interpretation.        Scheduled Meds:  aspirin EC  81 mg Oral BID   atorvastatin  80 mg Oral QHS  azaTHIOprine  50 mg Oral Daily   budesonide  0.5 mg Nebulization BID   clopidogrel  75 mg Oral Daily   docusate sodium  100 mg Oral BID   metoprolol tartrate  12.5 mg Oral BID   midazolam  1-2 mg Intravenous UD   pantoprazole  40 mg Oral BID   Continuous Infusions:  lactated ringers 75 mL/hr at 01/31/19 0900     LOS: 1 day    Time spent: 32 minutes spent on chart review, discussion with nursing staff, consultants, updating family and interview/physical exam; more than 50% of that time was spent in counseling and/or coordination of care.    Alvira PhilipsEric J [place], DO [facility] Pager [phone]  If 7PM-7AM, please contact night-coverage [email or web address] Password [email or web address] 01/31/2019, 11:45 AM

## 2019-02-01 LAB — BASIC METABOLIC PANEL
Anion gap: 10 (ref 5–15)
BUN: 16 mg/dL (ref 8–23)
CO2: 26 mmol/L (ref 22–32)
Calcium: 8 mg/dL — ABNORMAL LOW (ref 8.9–10.3)
Chloride: 104 mmol/L (ref 98–111)
Creatinine, Ser: 0.74 mg/dL (ref 0.44–1.00)
GFR calc Af Amer: >60 mL/min (ref >60)
GFR calc non Af Amer: >60 mL/min (ref >60)
Glucose, Bld: 111 mg/dL — ABNORMAL HIGH (ref 70–99)
Potassium: 4.3 mmol/L (ref 3.5–5.1)
Sodium: 140 mmol/L (ref 135–145)

## 2019-02-01 LAB — CBC
HCT: 29.9 % — ABNORMAL LOW (ref 36.0–46.0)
Hemoglobin: 9.2 g/dL — ABNORMAL LOW (ref 12.0–15.0)
MCH: 33.1 pg (ref 26.0–34.0)
MCHC: 30.8 g/dL (ref 30.0–36.0)
MCV: 107.6 fL — ABNORMAL HIGH (ref 80.0–100.0)
Platelets: 180 10*3/uL (ref 150–400)
RBC: 2.78 MIL/uL — ABNORMAL LOW (ref 3.87–5.11)
RDW: 15 % (ref 11.5–15.5)
WBC: 6.6 10*3/uL (ref 4.0–10.5)
nRBC: 0 % (ref 0.0–0.2)

## 2019-02-01 MED ORDER — ASPIRIN EC 81 MG PO TBEC: 81.0000 mg | DELAYED_RELEASE_TABLET | Freq: Two times a day (BID) | ORAL | 0 refills | Status: AC

## 2019-02-01 MED ORDER — OXYCODONE-ACETAMINOPHEN 5-325 MG PO TABS: 1.0000 | ORAL_TABLET | ORAL | 0 refills | Status: DC | PRN

## 2019-02-01 NOTE — Progress Notes (Signed)
PROGRESS NOTE    Kanyia Moriarity  ZTI:[id number] DOB: [birth year] DOA: 01/30/2019 PCP: System, Pcp Not In   Brief Narrative:  Laura [name] is a 68 y.o. female with medical history significant of CAD status post PCI/stent, COPD, OSA on CPAP, CVA, Crohn's disease, HLD, GERD who presents following fall with chief complaint of right ankle pain.  Patient was apparently coming down some steps in which she slipped and fell this morning.  She mainly reported severe right ankle discomfort.  She denies syncopal episode and recalls the event in its entirety.  She also reports recurrent nausea, believes it secondary to a medication side effect during the reduction of her ankle dislocation.  No other complaints or concerns at this time.  Specifically denies headache, no fever/chills/night sweats, no vomiting/diarrhea, no chest pain, palpitations, no shortness of breath, no abdominal pain.  In the ED, temperature 97.8, HR 61, RR 20, BP 130/58, SPO2 98% on 2 L nasal cannula (uses home oxygen prn).  The BC count 5.5, hemoglobin 10.6, platelet count 202, sodium 144, potassium 3.9, chloride 113, CO2 22, BUN 15, creatinine 0.73, glucose 93.  COVID-19/SARS-CoV-2 test negative. Right ankle x-ray notable for displaced medial malleolar fracture.  CT head with old right posterior parietal/left occipital infarct, no acute abnormality.  CT C-spine negative for acute fracture/subluxation. EKG with normal sinus rhythm, QTC 467, HR 58, LVH, no concerning ST elevation/depressions or T wave inversions.  Patient's ankle dislocation was reduced by ED provider.  Orthopedics, Dr. Ranell Patrick with [facility] consulted and plan operative intervention this evening.  TRH was consulted for admission.   Assessment & Plan:   Principal Problem:   Closed displaced bimalleolar fracture of right ankle Active Problems:   COPD (chronic obstructive pulmonary disease) (HCC)   CAD (coronary artery disease)   Crohn's disease (HCC)   OSA on  CPAP   HLD (hyperlipidemia)   PVD (peripheral vascular disease) (HCC)   GERD (gastroesophageal reflux disease)   Ankle fracture, right, closed, initial encounter  Closed displaced bimalleolar fracture of right ankle with dislocation Patient presenting with right ankle pain and foot deformity following mechanical fall earlier today.  Denies any loss of consciousness and recalls the event in its entirety.  X-ray right ankle with displaced bimalleolar fracture with subluxation, status post successful reduction by EDP.    Patient underwent successful ORIF of right ankle bimalleolar fracture by Dr. Ranell Patrick on 01/30/2019. --Continue splint, nonweightbearing right lower extremity --Vitamin D-25 hydroxy level 30.98, within normal limits but at low side --Norco 7.5mg  PO q6h prn for moderate pain --Aspirin 81 mg p.o. twice daily for DVT prophylaxis --Plan to discharge home with home health when returns to [place]  CAD/MI s/p stent Peripheral vascular disease s/p LLE stent two months prior Essential hypertension --Continue home metoprolol 12.5 mg p.o. twice daily, hold for HR less than 60 --Continue statin, Plavix, and aspirin  Crohn's disease, not in acute exacerbation --Continue Imuran 50 mg p.o. daily  GERD: Continue PPI  COPD Follows with pulmonary outpatient.  Uses oxygen prn at home. --Continue home nebs with formoterol every 12 hours and Pulmicort twice daily with hospital substitution as needed --Titrate supplemental oxygen to maintain SPO2 greater than 88%  Hx ischemic CVA, other nonhemorrhagic  CT head with notable old right posterior parietal and left occipital infarcts without acute findings. --Continue aspirin and statin   DVT prophylaxis: SCDs to left lower extremity only, aspirin 81 mg p.o. twice daily Code Status: Full code Family Communication: None present at bedside this  morning Disposition Plan: Continue inpatient, hopeful for discharge home with home health therapy  when returns to [place] tomorrow   Consultants:   [facility] - Dr. Ranell Patrick  Procedures:   Reduction of ankle dislocation by ED physician on 01/30/2019  ORIF right bimalleolar ankle fracture 01/30/2019: Dr. Delton See  Antimicrobials:   Perioperative cefazolin   Subjective: Patient seen and examined at bedside, resting comfortably.  Complains of pain requiring some increased pain medication overnight.  Reported to nursing that was nauseous and would like to remain hospitalized for an additional day.  Patient's pain currently controlled this morning on evaluation and able to move toes with improved sensation.  No other complaints or concerns at this time.  No other acute concerns overnight per nursing staff.  Denies headache, no chest pain, no palpitations, no shortness of breath, no abdominal pain.  Objective: Vitals:   01/31/19 2230 02/01/19 0512 02/01/19 0736 02/01/19 1406  BP:  112/64  (!) 158/70  Pulse: 67 70  68  Resp: 20 18  16   Temp:  98.4 F (36.9 C)  98.1 F (36.7 C)  TempSrc:  Oral  Oral  SpO2: 93% 94% 93% 96%  Weight:      Height:        Intake/Output Summary (Last 24 hours) at 02/01/2019 1437 Last data filed at 02/01/2019 1237 Gross per 24 hour  Intake 1742.22 ml  Output 500 ml  Net 1242.22 ml   Filed Weights   01/30/19 1116  Weight: 81.6 kg    Examination:  General exam: Appears calm and comfortable  Respiratory system: Clear to auscultation. Respiratory effort normal. Cardiovascular system: S1 & S2 heard, RRR. No JVD, murmurs, rubs, gallops or clicks. No pedal edema. Gastrointestinal system: Abdomen is nondistended, soft and nontender. No organomegaly or masses felt. Normal bowel sounds heard. Central nervous system: Alert and oriented. No focal neurological deficits. Extremities: Right lower extremity with splint/dressing in place, normal sensation with normal active ROM, cap refill less than 2 sec Skin: No rashes, lesions or ulcers Psychiatry:  Judgement and insight appear normal. Mood & affect appropriate.     Data Reviewed: I have personally reviewed following labs and imaging studies  CBC: Recent Labs  Lab 01/30/19 1247 01/31/19 0224 02/01/19 0222  WBC 5.5 6.3 6.6  NEUTROABS 4.3  --   --   HGB 10.6* 10.2* 9.2*  HCT 34.1* 32.8* 29.9*  MCV 105.6* 106.1* 107.6*  PLT 202 203 180   Basic Metabolic Panel: Recent Labs  Lab 01/30/19 1247 01/31/19 0224 02/01/19 0222  NA 144 141 140  K 3.9 3.7 4.3  CL 113* 106 104  CO2 22 23 26   GLUCOSE 93 117* 111*  BUN 15 12 16   CREATININE 0.73 0.61 0.74  CALCIUM 8.2* 7.8* 8.0*   GFR: Estimated Creatinine Clearance: 74.8 mL/min (by C-G formula based on SCr of 0.74 mg/dL). Liver Function Tests: No results for input(s): AST, ALT, ALKPHOS, BILITOT, PROT, ALBUMIN in the last 168 hours. No results for input(s): LIPASE, AMYLASE in the last 168 hours. No results for input(s): AMMONIA in the last 168 hours. Coagulation Profile: No results for input(s): INR, PROTIME in the last 168 hours. Cardiac Enzymes: No results for input(s): CKTOTAL, CKMB, CKMBINDEX, TROPONINI in the last 168 hours. BNP (last 3 results) No results for input(s): PROBNP in the last 8760 hours. HbA1C: No results for input(s): HGBA1C in the last 72 hours. CBG: No results for input(s): GLUCAP in the last 168 hours. Lipid Profile: No results for  input(s): CHOL, HDL, LDLCALC, TRIG, CHOLHDL, LDLDIRECT in the last 72 hours. Thyroid Function Tests: No results for input(s): TSH, T4TOTAL, FREET4, T3FREE, THYROIDAB in the last 72 hours. Anemia Panel: No results for input(s): VITAMINB12, FOLATE, FERRITIN, TIBC, IRON, RETICCTPCT in the last 72 hours. Sepsis Labs: No results for input(s): PROCALCITON, LATICACIDVEN in the last 168 hours.  Recent Results (from the past 240 hour(s))  SARS Coronavirus 2 [facility](Hospital order, Performed in [facility] hospital lab) Nasopharyngeal Nasopharyngeal Swab     Status: None   Collection  Time: 01/30/19  1:27 PM   Specimen: Nasopharyngeal Swab  Result Value Ref Range Status   SARS Coronavirus 2 NEGATIVE NEGATIVE Final    Comment: (NOTE) If result is NEGATIVE SARS-CoV-2 target nucleic acids are NOT DETECTED. The SARS-CoV-2 RNA is generally detectable in upper and lower  respiratory specimens during the acute phase of infection. The lowest  concentration of SARS-CoV-2 viral copies this assay can detect is 250  copies / mL. A negative result does not preclude SARS-CoV-2 infection  and should not be used as the sole basis for treatment or other  patient management decisions.  A negative result may occur with  improper specimen collection / handling, submission of specimen other  than nasopharyngeal swab, presence of viral mutation(s) within the  areas targeted by this assay, and inadequate number of viral copies  (<250 copies / mL). A negative result must be combined with clinical  observations, patient history, and epidemiological information. If result is POSITIVE SARS-CoV-2 target nucleic acids are DETECTED. The SARS-CoV-2 RNA is generally detectable in upper and lower  respiratory specimens dur ing the acute phase of infection.  Positive  results are indicative of active infection with SARS-CoV-2.  Clinical  correlation with patient history and other diagnostic information is  necessary to determine patient infection status.  Positive results do  not rule out bacterial infection or co-infection with other viruses. If result is PRESUMPTIVE POSTIVE SARS-CoV-2 nucleic acids MAY BE PRESENT.   A presumptive positive result was obtained on the submitted specimen  and confirmed on repeat testing.  While 2019 novel coronavirus  (SARS-CoV-2) nucleic acids may be present in the submitted sample  additional confirmatory testing may be necessary for epidemiological  and / or clinical management purposes  to differentiate between  SARS-CoV-2 and other Sarbecovirus currently known  to infect humans.  If clinically indicated additional testing with an alternate test  methodology [phone]([id number]) is advised. The SARS-CoV-2 RNA is generally  detectable in upper and lower respiratory sp ecimens during the acute  phase of infection. The expected result is Negative. Fact Sheet for Patients:  BoilerBrush.com.cyhttps://www.fda.gov/media/136312/download Fact Sheet for Healthcare Providers: https://pope.com/https://www.fda.gov/media/136313/download This test is not yet approved or cleared by the [place] FDA and has been authorized for detection and/or diagnosis of SARS-CoV-2 by FDA under an Emergency Use Authorization (EUA).  This EUA will remain in effect (meaning this test can be used) for the duration of the COVID-19 declaration under Section 564(b)(1) of the Act, 21 U.S.C. section 360bbb-3(b)(1), unless the authorization is terminated or revoked sooner. Performed at [facility], [address] [place], [place] [address]   Surgical pcr screen     Status: None   Collection Time: 01/30/19  5:42 PM   Specimen: Nasal Mucosa; Nasal Swab  Result Value Ref Range Status   MRSA, PCR NEGATIVE NEGATIVE Final   Staphylococcus aureus NEGATIVE NEGATIVE Final    Comment: (NOTE) The Xpert SA Assay (FDA approved for NASAL specimens  in patients 57 years of age and older), is one component of a comprehensive surveillance program. It is not intended to diagnose infection nor to guide or monitor treatment. Performed at [facility], [place] [address], [place], [place] [address]          Radiology Studies: Dg Ankle 2 Views Right  Result Date: 01/30/2019 CLINICAL DATA:  Post reduction. EXAM: RIGHT ANKLE - 2 VIEW COMPARISON:  Right ankle x-rays from same day. FINDINGS: Interval reduction of the bimalleolar fracture. No residual tibiotalar subluxation. The medial malleolar fracture is anatomic in alignment. There is some residual posterior displacement of the distal fibular  fracture. The posterior malleolus is not well evaluated due to overlapping structures and cast material. The talar dome appears intact. Joint spaces are preserved. Osteopenia. Diffuse soft tissue swelling about the ankle. IMPRESSION: Successful reduction of the bimalleolar ankle fracture subluxation. Electronically Signed   By: Titus Dubin M.D.   On: 01/30/2019 15:50   Dg Ankle Complete Right  Result Date: 01/30/2019 CLINICAL DATA:  ORIF of right ankle fracture EXAM: RIGHT ANKLE - COMPLETE 3+ VIEW COMPARISON:  Films from earlier in the same day. FLUOROSCOPY TIME:  Radiation Exposure Index (as provided by the fluoroscopic device): Not available If the device does not provide the exposure index: Fluoroscopy Time:  19 seconds Number of Acquired Images:  6 FINDINGS: Two fixation screws are now seen traversing the medial malleolar fracture. A fixation sideplate is now seen along the distal fibula with multiple fixation screws in place. IMPRESSION: Status post ORIF of the right ankle fractures. Electronically Signed   By: Inez Catalina M.D.   On: 01/30/2019 21:56   Dg Chest Portable 1 View  Result Date: 01/30/2019 CLINICAL DATA:  Preop. EXAM: PORTABLE CHEST 1 VIEW COMPARISON:  None. FINDINGS: Mild cardiomegaly is noted. Old bilateral rib fractures are noted. No pneumothorax is noted. Mild bibasilar atelectasis is noted. Small left pleural effusion may be present. IMPRESSION: Mild bibasilar subsegmental atelectasis. Small left pleural effusion. Electronically Signed   By: Marijo Conception M.D.   On: 01/30/2019 15:57   Dg C-arm 1-60 Min-no Report  Result Date: 01/30/2019 Fluoroscopy was utilized by the requesting physician.  No radiographic interpretation.        Scheduled Meds: . aspirin EC  81 mg Oral BID  . atorvastatin  80 mg Oral QHS  . azaTHIOprine  50 mg Oral Daily  . budesonide  0.5 mg Nebulization BID  . clopidogrel  75 mg Oral Daily  . docusate sodium  100 mg Oral BID  . metoprolol  tartrate  12.5 mg Oral BID  . pantoprazole  40 mg Oral BID   Continuous Infusions: . lactated ringers 75 mL/hr at 01/31/19 1800     LOS: 2 days    Time spent: 31 minutes spent on chart review, discussion with nursing staff, consultants, updating family and interview/physical exam; more than 50% of that time was spent in counseling and/or coordination of care.    Jacquis Paxton J [place] DO [facility] Pager [phone]  If 7PM-7AM, please contact night-coverage [email or web address] Password [facility] 02/01/2019, 2:37 PM

## 2019-02-01 NOTE — Progress Notes (Signed)
Physical Therapy Treatment Patient Details Name: Laura [name] MRN: [id number] DOB: 29-Aug-1950 Today's Date: 02/01/2019    History of Present Illness 68 y.o. female with medical history significant of CAD status post PCI/stent, COPD, OSA on CPAP, CVA, Crohn's disease, HLD, GERD and admitted for right ankle fracture.  Pt s/p Right ankle ORIF 01/30/19.    PT Comments    POD # 2 Pt working with OT earlier.  OOB in recliner on 2 lts nasal.  Daughter in room. General transfer comment: attempted transfer from recliner to [facility] however unable to complete due to MAX c/o nausea and fatigue.  "Please I can't do this right now".  Notified RN and simulated transfer with [employer].  Instructed to have pt wear L shoe for increased support, foot placement and to increase leg length.  Instructed to place BSC to pt's LEFT at 90 degree angle.  Instructed NOT to use walker if pt is just performing a 1/4 pivot turn.  Instructed on proper hand placement with partial sit to stand and proper hand transfer to complete stand to sit.  Also instructed to place a folded towel under cast as a visual reminder for NWB and aide in "easy slide"     Pt declines SNF and plans to travel back to [place] via car.  However, pt did not meet PT goals for safe D/C today due to nausea and activity inability.  Follow Up Recommendations  SNF     Equipment Recommendations  Wheelchair cushion (measurements PT);Wheelchair (measurements PT);3in1 (PT)    Recommendations for Other Services       Precautions / Restrictions Precautions Precautions: Fall Restrictions Weight Bearing Restrictions: Yes RLE Weight Bearing: Non weight bearing Other Position/Activity Restrictions: educated on elevation    Mobility  Bed Mobility         Supine to sit: Min assist     General bed mobility comments: OOB in recliner  Transfers Overall transfer level: Needs assistance Equipment used: Rolling walker (2 wheeled)   Sit to Stand: Min  assist Stand pivot transfers: Min assist       General transfer comment: attempted transfer from recliner to [facility] however unable to complete due to MAX c/o nausea and fatigue.  "Please I can't do this right now".  Notified RN and simulated transfer with [employer].  Instructed to have pt wear L shoe for increased support, foot placement and to increase leg length.  Instructed to place BSC to pt's LEFT at 90 degree angle.  Instructed NOT to use walker if pt is just performing a 1/4 pivot turn.  Instructed on proper hand placement with partial sit to stand and proper hand transfer to complete stand to sit.  Also instructed to place a folded towel under cast as a visual reminder for NWB and aide in "easy slide"  Ambulation/Gait             General Gait Details: transfers only   Stairs             Wheelchair Mobility    Modified Rankin (Stroke Patients Only)       Balance                                            Cognition Arousal/Alertness: Awake/alert Behavior During Therapy: WFL for tasks assessed/performed Overall Cognitive Status: Within Functional Limits for tasks assessed  General Comments: c/o Max fatigue and nausea      Exercises      General Comments General comments (skin integrity, edema, etc.): pt on 1 liter 02.  Needed rest breaks, 2/4 dyspnea with activity. Daughter present. She plans to take her home to [place] today      Pertinent Vitals/Pain Pain Assessment: Faces Pain Score: 4  Faces Pain Scale: Hurts little more Pain Location: R LE Pain Descriptors / Indicators: Discomfort;Grimacing Pain Intervention(s): Monitored during session;Repositioned    Home Living Family/patient expects to be discharged to:: Private residence   Available Help at Discharge: Family;Available 24 hours/day           Additional Comments: will go to daughter's in [place]. Needs BSC    Prior Function  Level of Independence: Independent          PT Goals (current goals can now be found in the care plan section) Acute Rehab PT Goals Patient Stated Goal: home asap Progress towards PT goals: Progressing toward goals    Frequency    Min 3X/week      PT Plan Current plan remains appropriate    Co-evaluation              AM-PAC PT "6 Clicks" Mobility   Outcome Measure  Help needed turning from your back to your side while in a flat bed without using bedrails?: A Lot Help needed moving from lying on your back to sitting on the side of a flat bed without using bedrails?: A Lot Help needed moving to and from a bed to a chair (including a wheelchair)?: A Lot Help needed standing up from a chair using your arms (e.g., wheelchair or bedside chair)?: A Lot Help needed to walk in hospital room?: Total Help needed climbing 3-5 steps with a railing? : Total 6 Click Score: 10    End of Session Equipment Utilized During Treatment: Gait belt;Oxygen Activity Tolerance: Patient limited by fatigue;Other (comment)(nausea) Patient left: in chair;with call bell/phone within reach;with chair alarm set;with family/visitor present Nurse Communication: Mobility status;Other (comment)(pt has NOT met PT goals for safe D/C) PT Visit Diagnosis: Other abnormalities of gait and mobility (R26.89)     Time: 3202-3343 PT Time Calculation (min) (ACUTE ONLY): 18 min  Charges:  $Therapeutic Activity: 8-22 mins                     Rica Koyanagi  PTA Acute  Rehabilitation Services Pager      [phone] Office      [phone]

## 2019-02-01 NOTE — Progress Notes (Signed)
Per nursing, patient has refused CPAP for the night.  Will call if she changes her mind.

## 2019-02-01 NOTE — Progress Notes (Signed)
Patient s/p right ankle ORIF, c/o of right posterior foot pain, 8-9/10 after receiving prn pain medication. Alert and oriented, VSS, peripheral pulses +1, and capillary refill <2 bilaterally. On call [facility] provider notified of pain issues. New orders received.

## 2019-02-01 NOTE — Evaluation (Signed)
Occupational Therapy Evaluation Patient Details Name: Laura [name] MRN: [id number] DOB: 09-17-1950 Today's Date: 02/01/2019    History of Present Illness 68 y.o. female with medical history significant of CAD status post PCI/stent, COPD, OSA on CPAP, CVA, Crohn's disease, HLD, GERD and admitted for right ankle fracture.  Pt s/p Right ankle ORIF 01/30/19.   Clinical Impression   Pt was admitted for the above injury/sx. She plans to return to [place] today with her daughter.  Performed SPT to [facility] with min A maintaining NWB. Daughter feels comfortable assisting with adls and toilet transfers.  Recommend a 3:1 for home and spoke to daughter about having it against something stable prior to transfer. No further OT needs.    Follow Up Recommendations  Supervision/Assistance - 24 hour    Equipment Recommendations  3 in 1 bedside commode    Recommendations for Other Services       Precautions / Restrictions Precautions Precautions: Fall Restrictions RLE Weight Bearing: Non weight bearing      Mobility Bed Mobility         Supine to sit: Min assist     General bed mobility comments: assist for trunk  Transfers   Equipment used: Rolling walker (2 wheeled)   Sit to Stand: Min assist Stand pivot transfers: Min assist       General transfer comment: min A to power up and steadying assistance for pivot to commode    Balance                                           ADL either performed or assessed with clinical judgement   ADL Overall ADL's : Needs assistance/impaired Eating/Feeding: Independent   Grooming: Set up   Upper Body Bathing: Set up   Lower Body Bathing: Moderate assistance;Sit to/from stand   Upper Body Dressing : Set up   Lower Body Dressing: Total assistance;Sit to/from stand   Toilet Transfer: Minimal assistance;Stand-pivot;BSC;RW   Toileting- Clothing Manipulation and Hygiene: Moderate assistance;Sitting/lateral lean          General ADL Comments: performed toileting, hygiene and donned depends type of garment     Vision         Perception     Praxis      Pertinent Vitals/Pain Pain Score: 4  Pain Location: headache Pain Descriptors / Indicators: Aching Pain Intervention(s): Limited activity within patient's tolerance;Monitored during session;Patient requesting pain meds-RN notified     Hand Dominance     Extremity/Trunk Assessment Upper Extremity Assessment Upper Extremity Assessment: Generalized weakness           Communication Communication Communication: No difficulties   Cognition Arousal/Alertness: Awake/alert Behavior During Therapy: WFL for tasks assessed/performed Overall Cognitive Status: Within Functional Limits for tasks assessed                                     General Comments  pt on 1 liter 02.  Needed rest breaks, 2/4 dyspnea with activity. Daughter present. She plans to take her home to [place] today    Exercises     Shoulder Instructions      Home Living Family/patient expects to be discharged to:: Private residence   Available Help at Discharge: Family;Available 24 hours/day  Additional Comments: will go to daughter's in [place]. Needs BSC      Prior Functioning/Environment Level of Independence: Independent                 OT Problem List: Decreased strength;Decreased activity tolerance;Cardiopulmonary status limiting activity;Pain;Decreased knowledge of use of DME or AE      OT Treatment/Interventions:      OT Goals(Current goals can be found in the care plan section) Acute Rehab OT Goals Patient Stated Goal: home asap OT Goal Formulation: With patient Time For Goal Achievement: 02/01/19 Potential to Achieve Goals: Good  OT Frequency:     Barriers to D/C:            Co-evaluation              AM-PAC OT "6 Clicks" Daily Activity     Outcome Measure Help from another person eating  meals?: None Help from another person taking care of personal grooming?: A Little Help from another person toileting, which includes using toliet, bedpan, or urinal?: A Lot Help from another person bathing (including washing, rinsing, drying)?: A Lot Help from another person to put on and taking off regular upper body clothing?: A Little Help from another person to put on and taking off regular lower body clothing?: Total 6 Click Score: 15   End of Session    Activity Tolerance: Patient tolerated treatment well Patient left: in chair;with call bell/phone within reach;with family/visitor present  OT Visit Diagnosis: Unsteadiness on feet (R26.81);Muscle weakness (generalized) (M62.81)                Time: 0076-2263 OT Time Calculation (min): 28 min Charges:  OT General Charges $OT Visit: 1 Visit OT Evaluation $OT Eval Low Complexity: 1 Low OT Treatments $Self Care/Home Management : 8-22 mins  Marica Otter, OTR/L Acute Rehabilitation Services [phone] [facility] pager [phone] office 02/01/2019  Laura [name] 02/01/2019, 12:45 PM

## 2019-02-01 NOTE — Anesthesia Postprocedure Evaluation (Signed)
Anesthesia Post Note  Patient: Laura [name]  Procedure(s) Performed: OPEN REDUCTION INTERNAL FIXATION (ORIF) ANKLE FRACTURE (Right Ankle)     Patient location during evaluation: PACU Anesthesia Type: General Level of consciousness: awake and alert Pain management: pain level controlled Vital Signs Assessment: post-procedure vital signs reviewed and stable Respiratory status: spontaneous breathing, nonlabored ventilation, respiratory function stable and patient connected to nasal cannula oxygen Cardiovascular status: blood pressure returned to baseline and stable Postop Assessment: no apparent nausea or vomiting Anesthetic complications: no    Last Vitals:  Vitals:   02/01/19 0512 02/01/19 0736  BP: 112/64   Pulse: 70   Resp: 18   Temp: 36.9 C   SpO2: 94% 93%    Last Pain:  Vitals:   02/01/19 0850  TempSrc:   PainSc: 7                  Amberleigh Gerken S

## 2019-02-01 NOTE — Care Management Important Message (Signed)
Important Message  Patient Details IM Letter given to Velva Harman RN to present to the Patient Name: Laura [name] MRN: [id number] Date of Birth: 1951/02/27   Medicare Important Message Given:  Yes     Kerin Salen 02/01/2019, 10:09 AM

## 2019-02-01 NOTE — Discharge Summary (Signed)
Physician Discharge Summary  Laura [name] JYN:[id number] DOB: Oct 19, 1950 DOA: 01/30/2019  PCP: System, Pcp Not In  Admit date: 01/30/2019 Discharge date: 02/01/2019  Admitted From: Home Disposition: Returning to Home in [place] with home health  Recommendations for Outpatient Follow-up:  1. Follow up with PCP in 1 week 2. Patient's home PCP to help with setting up orthopedics follow-up in hometown 3. Continue aspirin 81 mg p.o. twice daily for 30 days then may resume once daily dosing for postoperative DVT prophylaxis per orthopedics 4. Patient's home health orders faxed by case management to [facility] in [place] 5. Continue nonweightbearing right lower extremity until follows up with orthopedics  Home Health: Yes, PT Equipment/Devices: none  Discharge Condition: Stable CODE STATUS: Full code Diet recommendation: Heart Healthy   History of present illness:  Laura [name] a 68 y.o.femalewith medical history significant ofCAD status post PCI/stent, COPD, OSA on CPAP,CVA,Crohn's disease, HLD, GERD who presents following fall with chief complaint of right ankle pain. Patient was apparently coming down some steps in which she slipped and fell this morning. She mainly reported severe right ankle discomfort. She denies syncopal episode and recalls the event in its entirety. She also reports recurrent nausea, believes it secondary to a medication side effect during the reduction of her ankle dislocation. No other complaints or concerns at this time. Specifically denies headache, no fever/chills/night sweats, no vomiting/diarrhea, no chest pain, palpitations, no shortness of breath, no abdominal pain.  In the ED, temperature 97.8, HR 61, RR 20, BP 130/58, SPO2 98% on 2 L nasal cannula(uses home oxygen prn).The BC count 5.5, hemoglobin 10.6, platelet count 202, sodium 144, potassium 3.9, chloride 113,CO2 22, BUN 15, creatinine 0.73, glucose 93.  COVID-19/SARS-CoV-2 test negative. Right ankle x-ray notable for displaced medial malleolar fracture. CT head with old right posterior parietal/left occipital infarct, no acute abnormality. CT C-spine negative for acute fracture/subluxation. EKG with normal sinus rhythm, QTC 467, HR 58, LVH, no concerning ST elevation/depressions or T wave inversions. Patient's ankle dislocation was reduced by ED provider. Orthopedics, Dr. Veverly Fells with [facility] consulted and plan operative intervention this evening. [facility] was consulted for admission.  Hospital course:  Closed displaced bimalleolar fracture of right ankle with dislocation Patient presenting with right ankle pain and foot deformity following mechanical fall earlier today. Denies any loss of consciousness and recalls the event in its entirety. X-ray right ankle with displaced bimalleolar fracture with subluxation, status post successful reduction by EDP.  Patient underwent successful ORIF of right ankle bimalleolar fracture by Dr. Veverly Fells on 01/30/2019.  Patient to continue maintain splint in place and nonweightbearing to her right lower extremity.  Patient will return to her home in [place] to continue home health therapy.  Her PCP to arrange outpatient orthopedics follow-up for further instruction and monitoring of her fracture.  Patient continue aspirin 81 mg p.o. twice daily for 30 days then may resume once daily dosing.  Tramadol as needed for pain control.  CAD/MI s/p stent Peripheral vascular diseases/p LLE stent two months prior Essential hypertension Continue home metoprolol 12.5 mg p.o. twice daily, statin,Plavix,and aspirin (aspirin twice daily dosing for 30 days as above then may resume once daily dose)  Crohn's disease,not in acute exacerbation --Continue Imuran 50 mg p.o. daily  GERD: Continue PPI  COPD Follows with pulmonary outpatient. Uses oxygen prn at home.  Continue home nebs/inhalers, outpatient follow-up  with pulmonology as scheduled.  Hxischemic CVA,other nonhemorrhagic  CT head with notable old right posterior parietal and left occipital infarcts  without acute findings. Continue aspirin and statin   Discharge Diagnoses:  Principal Problem:   Closed displaced bimalleolar fracture of right ankle Active Problems:   COPD (chronic obstructive pulmonary disease) (HCC)   CAD (coronary artery disease)   Crohn's disease (HCC)   OSA on CPAP   HLD (hyperlipidemia)   PVD (peripheral vascular disease) (HCC)   GERD (gastroesophageal reflux disease)   Ankle fracture, right, closed, initial encounter    Discharge Instructions  Discharge Instructions    Call MD for:  difficulty breathing, headache or visual disturbances   Complete by: As directed    Call MD for:  extreme fatigue   Complete by: As directed    Call MD for:  persistant dizziness or light-headedness   Complete by: As directed    Call MD for:  persistant nausea and vomiting   Complete by: As directed    Call MD for:  severe uncontrolled pain   Complete by: As directed    Call MD for:  temperature >100.4   Complete by: As directed    Diet - low sodium heart healthy   Complete by: As directed    Increase activity slowly   Complete by: As directed    No weight bearing to right lower extremity until follows up with orthopedics   Other Restrictions   Complete by: As directed    No weight bearing to right leg until follows up with orthopedics     Allergies as of 02/01/2019   No Known Allergies     Medication List    TAKE these medications   alendronate 70 MG tablet Commonly known as: FOSAMAX Take 70 mg by mouth once a week.   aspirin EC 81 MG tablet Take 1 tablet (81 mg total) by mouth 2 (two) times daily. Take for 4 weeks then resume once daily What changed:   when to take this  additional instructions   atorvastatin 80 MG tablet Commonly known as: LIPITOR Take 80 mg by mouth at bedtime.   azaTHIOprine  50 MG tablet Commonly known as: IMURAN Take 50 mg by mouth daily.   budesonide 0.5 MG/2ML nebulizer solution Commonly known as: PULMICORT Take 0.5 mg by nebulization 2 (two) times daily.   clopidogrel 75 MG tablet Commonly known as: PLAVIX Take 75 mg by mouth daily.   ipratropium-albuterol 0.5-2.5 (3) MG/3ML Soln Commonly known as: DUONEB Inhale 3 mLs into the lungs every 6 (six) hours as needed for shortness of breath.   metoprolol tartrate 25 MG tablet Commonly known as: LOPRESSOR Take 12.5 mg by mouth 2 (two) times daily.   ondansetron 4 MG disintegrating tablet Commonly known as: ZOFRAN-ODT Take 4 mg by mouth every 4 (four) hours as needed for nausea/vomiting.   pantoprazole 40 MG tablet Commonly known as: PROTONIX Take 40 mg by mouth daily.   traMADol 50 MG tablet Commonly known as: Ultram Take 1 tablet (50 mg total) by mouth every 6 (six) hours as needed for moderate pain or severe pain.            Durable Medical Equipment  (From admission, onward)         Start     Ordered   01/31/19 1149  For home use only DME 3 n 1  Once     01/31/19 1148   01/31/19 1147  For home use only DME Hospital bed  Once    Question Answer Comment  Length of Need 6 Months   Patient has (list medical  condition): Right ankle fracture/dislocation, s/p surgery, severe mobility impairment and high fall risk   The above medical condition requires: Patient requires the ability to reposition frequently   Head must be elevated greater than: 30 degrees   Bed type Semi-electric      01/31/19 1148         Follow-up Information    Beverely Low, MD. Call in 2 weeks.   Specialty: Orthopedic Surgery Why: [phone] Contact information: [address] [place] [address] [phone]          No Known Allergies  Consultations:  [facility]: Dr. Ranell Patrick   Procedures/Studies: Dg Ankle 2 Views Right  Result Date: 01/30/2019 CLINICAL DATA:  Post  reduction. EXAM: RIGHT ANKLE - 2 VIEW COMPARISON:  Right ankle x-rays from same day. FINDINGS: Interval reduction of the bimalleolar fracture. No residual tibiotalar subluxation. The medial malleolar fracture is anatomic in alignment. There is some residual posterior displacement of the distal fibular fracture. The posterior malleolus is not well evaluated due to overlapping structures and cast material. The talar dome appears intact. Joint spaces are preserved. Osteopenia. Diffuse soft tissue swelling about the ankle. IMPRESSION: Successful reduction of the bimalleolar ankle fracture subluxation. Electronically Signed   By: Obie Dredge M.D.   On: 01/30/2019 15:50   Dg Ankle Complete Right  Result Date: 01/30/2019 CLINICAL DATA:  ORIF of right ankle fracture EXAM: RIGHT ANKLE - COMPLETE 3+ VIEW COMPARISON:  Films from earlier in the same day. FLUOROSCOPY TIME:  Radiation Exposure Index (as provided by the fluoroscopic device): Not available If the device does not provide the exposure index: Fluoroscopy Time:  19 seconds Number of Acquired Images:  6 FINDINGS: Two fixation screws are now seen traversing the medial malleolar fracture. A fixation sideplate is now seen along the distal fibula with multiple fixation screws in place. IMPRESSION: Status post ORIF of the right ankle fractures. Electronically Signed   By: Alcide Clever M.D.   On: 01/30/2019 21:56   Dg Ankle Complete Right  Result Date: 01/30/2019 CLINICAL DATA:  Fall down stairs today with right ankle injury. EXAM: RIGHT ANKLE - COMPLETE 3+ VIEW COMPARISON:  None. FINDINGS: Examination demonstrates moderate anteromedial subluxation of the tibia on the talus. There is a displaced transverse fracture of the medial malleolus. There is a displaced oblique fracture of the distal fibular metaphysis. Possible fracture along the posterolateral aspect of the distal tibia. Remainder the exam is unremarkable. IMPRESSION: Moderate subluxation anteromedially  of the tibia on the talus. Displaced medial malleolar fracture and distal fibular fracture. Possible fracture along the posterolateral aspect of the distal tibia. Electronically Signed   By: Elberta Fortis M.D.   On: 01/30/2019 12:42   Ct Head Wo Contrast  Result Date: 01/30/2019 CLINICAL DATA:  Head injury after falling down stairs today. EXAM: CT HEAD WITHOUT CONTRAST CT CERVICAL SPINE WITHOUT CONTRAST TECHNIQUE: Multidetector CT imaging of the head and cervical spine was performed following the standard protocol without intravenous contrast. Multiplanar CT image reconstructions of the cervical spine were also generated. COMPARISON:  None. 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 Old left occipital infarction is noted. Mild chronic ischemic white matter disease is noted. No mass effect or midline shift is noted. Ventricular size is within normal limits. There is no evidence of mass lesion, hemorrhage or acute infarction. Vascular: No hyperdense vessel or unexpected calcification. Skull: Normal. Negative for fracture or focal lesion. Sinuses/Orbits: No acute finding. Large metallic density  is seen in the soft tissues anterior to the left globe of unknown etiology. Other: None. CT CERVICAL SPINE FINDINGS Alignment: Minimal grade 1 anterolisthesis of C4-5 is noted secondary to posterior facet joint hypertrophy. Skull base and vertebrae: No acute fracture. No primary bone lesion or focal pathologic process. Soft tissues and spinal canal: No prevertebral fluid or swelling. No visible canal hematoma. Disc levels: Severe degenerative disc disease is noted at C5-6 and C6-7 with anterior posterior osteophyte formation. Upper chest: Negative. Other: There appears to be fusion of the posterior elements of C2, C3 and C4. Significant degenerative changes seen involving the left-sided posterior facet joint of C4-5. IMPRESSION: Old right posterior parietal and left occipital  infarctions. Mild chronic ischemic white matter disease. No acute intracranial abnormality seen. Large metallic density is seen in the soft tissues anterior to the left lobe of unknown etiology; clinical correlation is recommended. Severe multilevel degenerative disc disease. No acute abnormality seen in the cervical spine. Electronically Signed   By: Lupita Raider M.D.   On: 01/30/2019 12:32   Ct Cervical Spine Wo Contrast  Result Date: 01/30/2019 CLINICAL DATA:  Head injury after falling down stairs today. EXAM: CT HEAD WITHOUT CONTRAST CT CERVICAL SPINE WITHOUT CONTRAST TECHNIQUE: Multidetector CT imaging of the head and cervical spine was performed following the standard protocol without intravenous contrast. Multiplanar CT image reconstructions of the cervical spine were also generated. COMPARISON:  None. 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 Old left occipital infarction is noted. Mild chronic ischemic white matter disease is noted. No mass effect or midline shift is noted. Ventricular size is within normal limits. There is no evidence of mass lesion, hemorrhage or acute infarction. Vascular: No hyperdense vessel or unexpected calcification. Skull: Normal. Negative for fracture or focal lesion. Sinuses/Orbits: No acute finding. Large metallic density is seen in the soft tissues anterior to the left globe of unknown etiology. Other: None. CT CERVICAL SPINE FINDINGS Alignment: Minimal grade 1 anterolisthesis of C4-5 is noted secondary to posterior facet joint hypertrophy. Skull base and vertebrae: No acute fracture. No primary bone lesion or focal pathologic process. Soft tissues and spinal canal: No prevertebral fluid or swelling. No visible canal hematoma. Disc levels: Severe degenerative disc disease is noted at C5-6 and C6-7 with anterior posterior osteophyte formation. Upper chest: Negative. Other: There appears to be fusion of the posterior elements of  C2, C3 and C4. Significant degenerative changes seen involving the left-sided posterior facet joint of C4-5. 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 No acute intracranial abnormality seen. Large metallic density is seen in the soft tissues anterior to the left lobe of unknown etiology; clinical correlation is recommended. Severe multilevel degenerative disc disease. No acute abnormality seen in the cervical spine. Electronically Signed   By: Lupita Raider M.D.   On: 01/30/2019 12:32   Dg Chest Portable 1 View  Result Date: 01/30/2019 CLINICAL DATA:  Preop. EXAM: PORTABLE CHEST 1 VIEW COMPARISON:  None. FINDINGS: Mild cardiomegaly is noted. Old bilateral rib fractures are noted. No pneumothorax is noted. Mild bibasilar atelectasis is noted. Small left pleural effusion may be present. IMPRESSION: Mild bibasilar subsegmental atelectasis. Small left pleural effusion. Electronically Signed   By: Lupita Raider M.D.   On: 01/30/2019 15:57   Dg C-arm 1-60 Min-no Report  Result Date: 01/30/2019 Fluoroscopy was utilized by the requesting physician.  No radiographic interpretation.  Subjective: Patient seen and examined at bedside, resting comfortably.  Pain controlled.  Able to move right toes, feeling/sensation improved.  Ready for discharge home with anticipated return to home in [place] today.  No other complaints or concerns.  Denies headache, no fever/chills/night sweats, no nausea/vomiting/diarrhea, no chest pain, no palpitations, no abdominal pain, no weakness, no cough/congestion, no paresthesias.  No acute events overnight per nursing staff.   Discharge Exam: Vitals:   02/01/19 0512 02/01/19 0736  BP: 112/64   Pulse: 70   Resp: 18   Temp: 98.4 F (36.9 C)   SpO2: 94% 93%   Vitals:   01/31/19 2129 01/31/19 2230 02/01/19 0512 02/01/19 0736  BP: (!) 123/57  112/64   Pulse: 70 67 70   Resp: Temp: (!) 97.5 F (36.4 C)  98.4 F (36.9 C)   TempSrc: Oral  Oral   SpO2: 95% 93% 94% 93%  Weight:      Height:        General: Pt is alert, awake, not in acute distress Cardiovascular: RRR, S1/S2 +, no rubs, no gallops Respiratory: CTA bilaterally, no wheezing, no rhonchi Abdominal: Soft, NT, ND, bowel sounds + Extremities: no edema, no cyanosis, right lower extremity in a splint, neurovascular intact, able to move toes, cap refill less than 2 seconds    The results of significant diagnostics from this hospitalization (including imaging, microbiology, ancillary and laboratory) are listed below for reference.     Microbiology: Recent Results (from the past 240 hour(s))  SARS Coronavirus 2 [facility] order, Performed in [facility] hospital lab) Nasopharyngeal Nasopharyngeal Swab     Status: None   Collection Time: 01/30/19  1:27 PM   Specimen: Nasopharyngeal Swab  Result Value Ref Range Status   SARS Coronavirus 2 NEGATIVE NEGATIVE Final    Comment: (NOTE) If result is NEGATIVE SARS-CoV-2 target nucleic acids are NOT DETECTED. The SARS-CoV-2 RNA is generally detectable in upper and lower  respiratory specimens during the acute phase of infection. The lowest  concentration of SARS-CoV-2 viral copies this assay can detect is 250  copies / mL. A negative result does not preclude SARS-CoV-2 infection  and should not be used as the sole basis for treatment or other  patient management decisions.  A negative result may occur with  improper specimen collection / handling, submission of specimen other  than nasopharyngeal swab, presence of viral mutation(s) within the  areas targeted by this assay, and inadequate number of viral copies  (<250 copies / mL). A negative result must be combined with clinical  observations, patient history, and epidemiological information. If result is POSITIVE SARS-CoV-2 target nucleic acids are DETECTED. The SARS-CoV-2 RNA is generally detectable in  upper and lower  respiratory specimens dur ing the acute phase of infection.  Positive  results are indicative of active infection with SARS-CoV-2.  Clinical  correlation with patient history and other diagnostic information is  necessary to determine patient infection status.  Positive results do  not rule out bacterial infection or co-infection with other viruses. If result is PRESUMPTIVE POSTIVE SARS-CoV-2 nucleic acids MAY BE PRESENT.   A presumptive positive result was obtained on the submitted specimen  and confirmed on repeat testing.  While 2019 novel coronavirus  (SARS-CoV-2) nucleic acids may be present in the submitted sample  additional confirmatory testing may be necessary for epidemiological  and / or clinical management purposes  to differentiate between  SARS-CoV-2 and other Sarbecovirus currently known to infect  humans.  If clinically indicated additional testing with an alternate test  methodology [phone]([id number]) is advised. The SARS-CoV-2 RNA is generally  detectable in upper and lower respiratory sp ecimens during the acute  phase of infection. The expected result is Negative. Fact Sheet for Patients:  BoilerBrush.com.cyhttps://www.fda.gov/media/136312/download Fact Sheet for Healthcare Providers: https://pope.com/https://www.fda.gov/media/136313/download This test is not yet approved or cleared by the [place] FDA and has been authorized for detection and/or diagnosis of SARS-CoV-2 by FDA under an Emergency Use Authorization (EUA).  This EUA will remain in effect (meaning this test can be used) for the duration of the COVID-19 declaration under Section 564(b)(1) of the Act, 21 U.S.C. section 360bbb-3(b)(1), unless the authorization is terminated or revoked sooner. Performed at [facility], [address] [place], [place] [address]   Surgical pcr screen     Status: None   Collection Time: 01/30/19  5:42 PM   Specimen: Nasal Mucosa; Nasal Swab  Result Value Ref Range Status    MRSA, PCR NEGATIVE NEGATIVE Final   Staphylococcus aureus NEGATIVE NEGATIVE Final    Comment: (NOTE) The Xpert SA Assay (FDA approved for NASAL specimens in patients 70 years of age and older), is one component of a comprehensive surveillance program. It is not intended to diagnose infection nor to guide or monitor treatment. Performed at [facility], [address] [place], [place] [address]      Labs: BNP (last 3 results) No results for input(s): BNP in the last 8760 hours. Basic Metabolic Panel: Recent Labs  Lab 01/30/19 1247 01/31/19 0224 02/01/19 0222  NA 144 141 140  K 3.9 3.7 4.3  CL 113* 106 104  CO2 22 23 26   GLUCOSE 93 117* 111*  BUN 15 12 16   CREATININE 0.73 0.61 0.74  CALCIUM 8.2* 7.8* 8.0*   Liver Function Tests: No results for input(s): AST, ALT, ALKPHOS, BILITOT, PROT, ALBUMIN in the last 168 hours. No results for input(s): LIPASE, AMYLASE in the last 168 hours. No results for input(s): AMMONIA in the last 168 hours. CBC: Recent Labs  Lab 01/30/19 1247 01/31/19 0224 02/01/19 0222  WBC 5.5 6.3 6.6  NEUTROABS 4.3  --   --   HGB 10.6* 10.2* 9.2*  HCT 34.1* 32.8* 29.9*  MCV 105.6* 106.1* 107.6*  PLT 202 203 180   Cardiac Enzymes: No results for input(s): CKTOTAL, CKMB, CKMBINDEX, TROPONINI in the last 168 hours. BNP: Invalid input(s): POCBNP CBG: No results for input(s): GLUCAP in the last 168 hours. D-Dimer No results for input(s): DDIMER in the last 72 hours. Hgb A1c No results for input(s): HGBA1C in the last 72 hours. Lipid Profile No results for input(s): CHOL, HDL, LDLCALC, TRIG, CHOLHDL, LDLDIRECT in the last 72 hours. Thyroid function studies No results for input(s): TSH, T4TOTAL, T3FREE, THYROIDAB in the last 72 hours.  Invalid input(s): FREET3 Anemia work up No results for input(s): VITAMINB12, FOLATE, FERRITIN, TIBC, IRON, RETICCTPCT in the last 72 hours. Urinalysis No results found for: COLORURINE,  APPEARANCEUR, LABSPEC, PHURINE, GLUCOSEU, HGBUR, BILIRUBINUR, KETONESUR, PROTEINUR, UROBILINOGEN, NITRITE, LEUKOCYTESUR Sepsis Labs Invalid input(s): PROCALCITONIN,  WBC,  LACTICIDVEN Microbiology Recent Results (from the past 240 hour(s))  SARS Coronavirus 2 [facility](Hospital order, Performed in [facility] hospital lab) Nasopharyngeal Nasopharyngeal Swab     Status: None   Collection Time: 01/30/19  1:27 PM   Specimen: Nasopharyngeal Swab  Result Value Ref Range Status   SARS Coronavirus 2 NEGATIVE NEGATIVE Final    Comment: (NOTE) If result is NEGATIVE  SARS-CoV-2 target nucleic acids are NOT DETECTED. The SARS-CoV-2 RNA is generally detectable in upper and lower  respiratory specimens during the acute phase of infection. The lowest  concentration of SARS-CoV-2 viral copies this assay can detect is 250  copies / mL. A negative result does not preclude SARS-CoV-2 infection  and should not be used as the sole basis for treatment or other  patient management decisions.  A negative result may occur with  improper specimen collection / handling, submission of specimen other  than nasopharyngeal swab, presence of viral mutation(s) within the  areas targeted by this assay, and inadequate number of viral copies  (<250 copies / mL). A negative result must be combined with clinical  observations, patient history, and epidemiological information. If result is POSITIVE SARS-CoV-2 target nucleic acids are DETECTED. The SARS-CoV-2 RNA is generally detectable in upper and lower  respiratory specimens dur ing the acute phase of infection.  Positive  results are indicative of active infection with SARS-CoV-2.  Clinical  correlation with patient history and other diagnostic information is  necessary to determine patient infection status.  Positive results do  not rule out bacterial infection or co-infection with other viruses. If result is PRESUMPTIVE POSTIVE SARS-CoV-2 nucleic acids MAY BE PRESENT.   A  presumptive positive result was obtained on the submitted specimen  and confirmed on repeat testing.  While 2019 novel coronavirus  (SARS-CoV-2) nucleic acids may be present in the submitted sample  additional confirmatory testing may be necessary for epidemiological  and / or clinical management purposes  to differentiate between  SARS-CoV-2 and other Sarbecovirus currently known to infect humans.  If clinically indicated additional testing with an alternate test  methodology [phone] is advised. The SARS-CoV-2 RNA is generally  detectable in upper and lower respiratory sp ecimens during the acute  phase of infection. The expected result is Negative. Fact Sheet for Patients:  BoilerBrush.com.cy Fact Sheet for Healthcare Providers: https://pope.com/ This test is not yet approved or cleared by the [place] FDA and has been authorized for detection and/or diagnosis of SARS-CoV-2 by FDA under an Emergency Use Authorization (EUA).  This EUA will remain in effect (meaning this test can be used) for the duration of the COVID-19 declaration under Section 564(b)(1) of the Act, 21 U.S.C. section 360bbb-3(b)(1), unless the authorization is terminated or revoked sooner. Performed at [facility], [address] [place], [place] [address]   Surgical pcr screen     Status: None   Collection Time: 01/30/19  5:42 PM   Specimen: Nasal Mucosa; Nasal Swab  Result Value Ref Range Status   MRSA, PCR NEGATIVE NEGATIVE Final   Staphylococcus aureus NEGATIVE NEGATIVE Final    Comment: (NOTE) The Xpert SA Assay (FDA approved for NASAL specimens in patients 54 years of age and older), is one component of a comprehensive surveillance program. It is not intended to diagnose infection nor to guide or monitor treatment. Performed at [facility], [address], [place], [place] [address]      Time coordinating  discharge: Over 30 minutes  SIGNED:   Alvira Philips [place], DO  [facility] 02/01/2019, 8:36 AM

## 2019-02-01 NOTE — Discharge Summary (Signed)
Orthopedic Discharge Summary        Physician Discharge Summary  Patient ID: Laura [name] MRN: [id number] DOB/AGE: 70-24-1952 70 y.o.  Admit date: 01/30/2019 Discharge date: 02/01/2019   Procedures:  Procedure(s) (LRB): OPEN REDUCTION INTERNAL FIXATION (ORIF) ANKLE FRACTURE (Right)  Attending Physician:  Dr. Malon Kindle  Admission Diagnoses:   Right ankle fracture dislocation  Discharge Diagnoses:  same   Past Medical History:  Diagnosis Date  . COPD (chronic obstructive pulmonary disease) (HCC)   . PAD (peripheral artery disease) (HCC)   . Stented coronary artery     PCP: System, Pcp Not In   Discharged Condition: good  Hospital Course:  Patient underwent the above stated procedure on 01/30/2019. Patient tolerated the procedure well and brought to the recovery room in good condition and subsequently to the floor. Patient had an uncomplicated hospital course and was stable for discharge. Patent will need 30 days of DVT prophylaxis - twice daily 81 mg ASA and TED hose   Disposition: Discharge disposition: 01-Home or Self Care      with follow up in 2 weeks   Follow-up Information    Beverely Low, MD. Call in 2 weeks.   Specialty: Orthopedic Surgery Why: [phone] Contact information: [address] [place] [address] [place] [address] [phone]           Discharge Instructions    Call MD for:  difficulty breathing, headache or visual disturbances   Complete by: As directed    Call MD for:  extreme fatigue   Complete by: As directed    Call MD for:  persistant dizziness or light-headedness   Complete by: As directed    Call MD for:  persistant nausea and vomiting   Complete by: As directed    Call MD for:  severe uncontrolled pain   Complete by: As directed    Call MD for:  temperature >100.4   Complete by: As directed    Diet - low sodium heart healthy   Complete by: As directed    Increase activity slowly   Complete by: As  directed    No weight bearing to right lower extremity until follows up with orthopedics   Other Restrictions   Complete by: As directed    No weight bearing to right leg until follows up with orthopedics      Allergies as of 02/01/2019   No Known Allergies     Medication List    TAKE these medications   alendronate 70 MG tablet Commonly known as: FOSAMAX Take 70 mg by mouth once a week. Notes to patient: Take as prescribed   aspirin EC 81 MG tablet Take 1 tablet (81 mg total) by mouth 2 (two) times daily. Take for 4 weeks then resume once daily What changed:   when to take this  additional instructions   atorvastatin 80 MG tablet Commonly known as: LIPITOR Take 80 mg by mouth at bedtime.   azaTHIOprine 50 MG tablet Commonly known as: IMURAN Take 50 mg by mouth daily.   budesonide 0.5 MG/2ML nebulizer solution Commonly known as: PULMICORT Take 0.5 mg by nebulization 2 (two) times daily.   clopidogrel 75 MG tablet Commonly known as: PLAVIX Take 75 mg by mouth daily.   ipratropium-albuterol 0.5-2.5 (3) MG/3ML Soln Commonly known as: DUONEB Inhale 3 mLs into the lungs every 6 (six) hours as needed for shortness of breath.   metoprolol tartrate 25 MG tablet Commonly known as: LOPRESSOR Take 12.5 mg by mouth  2 (two) times daily.   ondansetron 4 MG disintegrating tablet Commonly known as: ZOFRAN-ODT Take 4 mg by mouth every 4 (four) hours as needed for nausea/vomiting.   oxyCODONE-acetaminophen 5-325 MG tablet Commonly known as: Percocet Take 1 tablet by mouth every 4 (four) hours as needed for severe pain.   pantoprazole 40 MG tablet Commonly known as: PROTONIX Take 40 mg by mouth daily.            Durable Medical Equipment  (From admission, onward)         Start     Ordered   01/31/19 1149  For home use only DME 3 n 1  Once     01/31/19 1148   01/31/19 1147  For home use only DME Hospital bed  Once    Question Answer Comment  Length of Need 6  Months   Patient has (list medical condition): Right ankle fracture/dislocation, s/p surgery, severe mobility impairment and high fall risk   The above medical condition requires: Patient requires the ability to reposition frequently   Head must be elevated greater than: 30 degrees   Bed type Semi-electric      01/31/19 1148            Signed: Augustin Schooling 02/01/2019, 10:00 AM  [place] is now [facility] [address] [place], [place], [place] [address] Phone: [phone] Facebook  [employer]

## 2019-02-01 NOTE — TOC Progression Note (Addendum)
Transition of Care [facility]) - Progression Note    Patient Details  Name: Keirsten Matuska MRN: [id number] Date of Birth: 10-19-1950  Transition of Care [facility]) CM/SW Contact  Leeroy Cha, RN Phone Number: 02/01/2019, 10:02 AM  Clinical Narrative:    Alphonzo Severance on [phone] to dme/ tct-[facility], orders and information for contact faxed to [phone] Patient to return to [facility]. With the daughter/husband is contacting dme.       Expected Discharge Plan and Services           Expected Discharge Date: 02/01/19                                     Social Determinants of Health (SDOH) Interventions    Readmission Risk Interventions No flowsheet data found.

## 2019-02-02 LAB — CBC
HCT: 28.8 % — ABNORMAL LOW (ref 36.0–46.0)
Hemoglobin: 8.9 g/dL — ABNORMAL LOW (ref 12.0–15.0)
MCH: 33 pg (ref 26.0–34.0)
MCHC: 30.9 g/dL (ref 30.0–36.0)
MCV: 106.7 fL — ABNORMAL HIGH (ref 80.0–100.0)
Platelets: 178 10*3/uL (ref 150–400)
RBC: 2.7 MIL/uL — ABNORMAL LOW (ref 3.87–5.11)
RDW: 15.2 % (ref 11.5–15.5)
WBC: 5.3 10*3/uL (ref 4.0–10.5)
nRBC: 0 % (ref 0.0–0.2)

## 2019-02-02 MED ORDER — OXYCODONE-ACETAMINOPHEN 5-325 MG PO TABS: 1.0000 | ORAL_TABLET | Freq: Two times a day (BID) | ORAL | 0 refills | Status: DC | PRN

## 2019-02-02 MED ORDER — SENNA 8.6 MG PO TABS: 2.0000 | ORAL_TABLET | Freq: Every day | ORAL | 0 refills | Status: AC

## 2019-02-02 NOTE — Discharge Summary (Addendum)
Discharge Summary  Laura [name] OBS:[id number] DOB: 18-Jun-1950  PCP: System, Pcp Not In  Admit date: 01/30/2019 Discharge date: 02/02/2019  Time spent: 25 minutes  Recommendations for Outpatient Follow-up:  1. Follow up with PCP in 1 week 2. Patient's home PCP to help with setting up orthopedics follow-up in hometown 3. Continue aspirin 81 mg p.o. twice daily for 30 days then may resume once daily dosing for postoperative DVT prophylaxis per orthopedics 4. Patient's home health orders faxed by case management to [facility] [place] 5. Continue nonweightbearing right lower extremity until follows up with orthopedics  PT recommends 3n1 bedside comode, wheelchair, walker, and hospital bed due to ambulatory dysfunction.    Patient suffers from ambulatory dysfunction which impairs their ability to perform daily activities like walking in the home.  A walking aid will not resolve issue with performing activities of daily living. A wheelchair will allow patient to safely perform daily activities. Patient can safely propel the wheelchair in the home or has a caregiver who can provide assistance. Length of need 6 months. Accessories: elevating leg rests, wheel locks, extensions and anti-tippers.   Home Health: Yes, PT Equipment/Devices: none  Discharge Condition: Stable CODE STATUS: Full code Diet recommendation: Heart Healthy   Discharge Diagnoses:  Active Hospital Problems   Diagnosis Date Noted   Closed displaced bimalleolar fracture of right ankle 01/30/2019   COPD (chronic obstructive pulmonary disease) ([place]) 01/30/2019   CAD (coronary artery disease) 01/30/2019   Crohn's disease ([place]) 01/30/2019   OSA on CPAP 01/30/2019   HLD (hyperlipidemia) 01/30/2019   PVD (peripheral vascular disease) ([place]) 01/30/2019   GERD (gastroesophageal reflux disease) 01/30/2019   Ankle fracture, right, closed, initial encounter 01/30/2019    Resolved Hospital  Problems  No resolved problems to display.    Vitals:   02/02/19 0600 02/02/19 0818  BP: 133/64 (!) 153/66  Pulse: 68 74  Resp: 16   Temp: 98.4 F (36.9 C)   SpO2: 95%     History of present illness:  Laura [name] a 68 y.o.femalewith medical history significant ofCAD status post PCI/stent, COPD, OSA on CPAP,CVA,Crohn's disease, HLD, GERD who presents following fall with chief complaint of right ankle pain. Patient was apparently coming down some steps in which she slipped and fell this morning. She mainly reported severe right ankle discomfort. She denies syncopal episode and recalls the event in its entirety. She also reports recurrent nausea, believes it secondary to a medication side effect during the reduction of her ankle dislocation. No other complaints or concerns at this time. Specifically denies headache, no fever/chills/night sweats, no vomiting/diarrhea, no chest pain, palpitations, no shortness of breath, no abdominal pain.  In the ED, temperature 97.8, HR 61, RR 20, BP 130/58, SPO2 98% on 2 L nasal cannula(uses home oxygen prn).The BC count 5.5, hemoglobin 10.6, platelet count 202, sodium 144, potassium 3.9, chloride 113,CO2 22, BUN 15, creatinine 0.73, glucose 93. COVID-19/SARS-CoV-2 test negative. Right ankle x-ray notable for displaced medial malleolar fracture. CT head with old right posterior parietal/left occipital infarct, no acute abnormality. CT C-spine negative for acute fracture/subluxation. EKG with normal sinus rhythm, QTC 467, HR 58, LVH, no concerning ST elevation/depressions or T wave inversions. Patient's ankle dislocation was reduced by ED provider. Orthopedics, Dr. Veverly Fells with [facility] consulted and plan operative intervention this evening. [facility] was consulted for admission.  02/02/19: Patient was seen and examined at her bedside this morning.  She wants to go home.  Cleared by orthopedic surgery to be  discharged.  Will need to  follow-up in 2 weeks as recommended by orthopedic surgery.  Hospital Course:  Principal Problem:   Closed displaced bimalleolar fracture of right ankle Active Problems:   COPD (chronic obstructive pulmonary disease) ([facility])   CAD (coronary artery disease)   Crohn's disease ([facility])   OSA on CPAP   HLD (hyperlipidemia)   PVD (peripheral vascular disease) ([facility])   GERD (gastroesophageal reflux disease)   Ankle fracture, right, closed, initial encounter  Closed displaced bimalleolar fracture of right anklewith dislocation Patient presenting with right ankle pain and foot deformity following mechanical fall earlier today. Denies any loss of consciousness and recalls the event in its entirety. X-ray right ankle with displaced bimalleolar fracture with subluxation, status post successful reduction by EDP.Patient underwent successful ORIF of right ankle bimalleolar fracture by Dr. Maurilio LovelyNorrison 01/30/2019.  Patient to continue maintain splint in place and nonweightbearing to her right lower extremity.  Patient will return to her home in [place] to continue home health therapy.  Her PCP to arrange outpatient orthopedics follow-up for further instruction and monitoring of her fracture.  Patient continue aspirin 81 mg p.o. twice daily for 30 days then may resume once daily dosing.  Percocet as needed for severe pain.  CAD/MI s/p stent Peripheral vascular diseases/p LLE stent two months prior Essential hypertension Continue home metoprolol 12.5 mg p.o. twice daily, statin,Plavix,and aspirin (aspirin twice daily dosing for 30 days as above then may resume once daily dose)  Crohn's disease,not in acute exacerbation --Continue Imuran 50 mg p.o. daily  GERD: Continue PPI  COPD Follows with pulmonary outpatient. Uses oxygen prn at home.  Continue home nebs/inhalers, outpatient follow-up with pulmonology as scheduled.  Hxischemic CVA,other nonhemorrhagic  CT head with notable old right  posterior parietal and left occipital infarcts without acute findings. Continue aspirin and statin   Discharge Exam: BP (!) 153/66    Pulse 74    Temp 98.4 F (36.9 C) (Oral)    Resp 16    Ht 5\' 9"  (1.753 m)    Wt 81.6 kg    SpO2 95%    BMI 26.58 kg/m   General: 68 y.o. year-old female well developed well nourished in no acute distress.  Alert and oriented x3.  Cardiovascular: Regular rate and rhythm with no rubs or gallops.  No thyromegaly or JVD noted.    Respiratory: Clear to auscultation with no wheezes or rales. Good inspiratory effort.  Abdomen: Soft nontender nondistended with normal bowel sounds x4 quadrants.  Musculoskeletal: R ankle in splint.  Psychiatry: Mood is appropriate for condition and setting  Discharge Instructions You were cared for by a hospitalist during your hospital stay. If you have any questions about your discharge medications or the care you received while you were in the hospital after you are discharged, you can call the unit and asked to speak with the hospitalist on call if the hospitalist that took care of you is not available. Once you are discharged, your primary care physician will handle any further medical issues. Please note that NO REFILLS for any discharge medications will be authorized once you are discharged, as it is imperative that you return to your primary care physician (or establish a relationship with a primary care physician if you do not have one) for your aftercare needs so that they can reassess your need for medications and monitor your lab values.  Discharge Instructions    Call MD for:  difficulty breathing, headache or visual disturbances   Complete  by: As directed    Call MD for:  extreme fatigue   Complete by: As directed    Call MD for:  persistant dizziness or light-headedness   Complete by: As directed    Call MD for:  persistant nausea and vomiting   Complete by: As directed    Call MD for:  severe uncontrolled pain    Complete by: As directed    Call MD for:  temperature >100.4   Complete by: As directed    Diet - low sodium heart healthy   Complete by: As directed    Increase activity slowly   Complete by: As directed    No weight bearing to right lower extremity until follows up with orthopedics   Other Restrictions   Complete by: As directed    No weight bearing to right leg until follows up with orthopedics     Allergies as of 02/02/2019   No Known Allergies     Medication List    TAKE these medications   alendronate 70 MG tablet Commonly known as: FOSAMAX Take 70 mg by mouth once a week. Notes to patient: Take as prescribed   aspirin EC 81 MG tablet Take 1 tablet (81 mg total) by mouth 2 (two) times daily. Take for 4 weeks then resume once daily What changed:   when to take this  additional instructions   atorvastatin 80 MG tablet Commonly known as: LIPITOR Take 80 mg by mouth at bedtime.   azaTHIOprine 50 MG tablet Commonly known as: IMURAN Take 50 mg by mouth daily.   budesonide 0.5 MG/2ML nebulizer solution Commonly known as: PULMICORT Take 0.5 mg by nebulization 2 (two) times daily.   clopidogrel 75 MG tablet Commonly known as: PLAVIX Take 75 mg by mouth daily.   ipratropium-albuterol 0.5-2.5 (3) MG/3ML Soln Commonly known as: DUONEB Inhale 3 mLs into the lungs every 6 (six) hours as needed for shortness of breath.   metoprolol tartrate 25 MG tablet Commonly known as: LOPRESSOR Take 12.5 mg by mouth 2 (two) times daily.   ondansetron 4 MG disintegrating tablet Commonly known as: ZOFRAN-ODT Take 4 mg by mouth every 4 (four) hours as needed for nausea/vomiting.   oxyCODONE-acetaminophen 5-325 MG tablet Commonly known as: Percocet Take 1 tablet by mouth 2 (two) times daily as needed for severe pain.   pantoprazole 40 MG tablet Commonly known as: PROTONIX Take 40 mg by mouth daily.   senna 8.6 MG Tabs tablet Commonly known as: SENOKOT Take 2 tablets  (17.2 mg total) by mouth at bedtime.            Durable Medical Equipment  (From admission, onward)         Start     Ordered   02/02/19 1455  For home use only DME standard manual wheelchair with seat cushion  Once    Comments: Patient suffers from ambulatory dysfunction which impairs their ability to perform daily activities like walking in the home.  A walking aid will not resolve issue with performing activities of daily living. A wheelchair will allow patient to safely perform daily activities. Patient can safely propel the wheelchair in the home or has a caregiver who can provide assistance. Length of need 6 months. Accessories: elevating leg rests, wheel locks, extensions and anti-tippers.   02/02/19 1455   02/02/19 1440  For home use only DME Walker rolling  Once    Question:  Patient needs a walker to treat with the following condition  Answer:  Unsteady gait   02/02/19 1439   01/31/19 1149  For home use only DME 3 n 1  Once     01/31/19 1148   01/31/19 1147  For home use only DME Hospital bed  Once    Question Answer Comment  Length of Need 6 Months   Patient has (list medical condition): Right ankle fracture/dislocation, s/p surgery, severe mobility impairment and high fall risk   The above medical condition requires: Patient requires the ability to reposition frequently   Head must be elevated greater than: 30 degrees   Bed type Semi-electric      01/31/19 1148         No Known Allergies Follow-up Information    Beverely Low, MD. Call in 2 weeks.   Specialty: Orthopedic Surgery Why: [phone] Contact information: [address] [place] [address] [phone]            The results of significant diagnostics from this hospitalization (including imaging, microbiology, ancillary and laboratory) are listed below for reference.    Significant Diagnostic Studies: Dg Ankle 2 Views Right  Result Date: 01/30/2019 CLINICAL DATA:  Post  reduction. EXAM: RIGHT ANKLE - 2 VIEW COMPARISON:  Right ankle x-rays from same day. FINDINGS: Interval reduction of the bimalleolar fracture. No residual tibiotalar subluxation. The medial malleolar fracture is anatomic in alignment. There is some residual posterior displacement of the distal fibular fracture. The posterior malleolus is not well evaluated due to overlapping structures and cast material. The talar dome appears intact. Joint spaces are preserved. Osteopenia. Diffuse soft tissue swelling about the ankle. IMPRESSION: Successful reduction of the bimalleolar ankle fracture subluxation. Electronically Signed   By: Obie Dredge M.D.   On: 01/30/2019 15:50   Dg Ankle Complete Right  Result Date: 01/30/2019 CLINICAL DATA:  ORIF of right ankle fracture EXAM: RIGHT ANKLE - COMPLETE 3+ VIEW COMPARISON:  Films from earlier in the same day. FLUOROSCOPY TIME:  Radiation Exposure Index (as provided by the fluoroscopic device): Not available If the device does not provide the exposure index: Fluoroscopy Time:  19 seconds Number of Acquired Images:  6 FINDINGS: Two fixation screws are now seen traversing the medial malleolar fracture. A fixation sideplate is now seen along the distal fibula with multiple fixation screws in place. IMPRESSION: Status post ORIF of the right ankle fractures. Electronically Signed   By: Alcide Clever M.D.   On: 01/30/2019 21:56   Dg Ankle Complete Right  Result Date: 01/30/2019 CLINICAL DATA:  Fall down stairs today with right ankle injury. EXAM: RIGHT ANKLE - COMPLETE 3+ VIEW COMPARISON:  None. FINDINGS: Examination demonstrates moderate anteromedial subluxation of the tibia on the talus. There is a displaced transverse fracture of the medial malleolus. There is a displaced oblique fracture of the distal fibular metaphysis. Possible fracture along the posterolateral aspect of the distal tibia. Remainder the exam is unremarkable. IMPRESSION: Moderate subluxation anteromedially  of the tibia on the talus. Displaced medial malleolar fracture and distal fibular fracture. Possible fracture along the posterolateral aspect of the distal tibia. Electronically Signed   By: Elberta Fortis M.D.   On: 01/30/2019 12:42   Ct Head Wo Contrast  Result Date: 01/30/2019 CLINICAL DATA:  Head injury after falling down stairs today. EXAM: CT HEAD WITHOUT CONTRAST CT CERVICAL SPINE WITHOUT CONTRAST TECHNIQUE: Multidetector CT imaging of the head and cervical spine was performed following the standard protocol without intravenous contrast. Multiplanar CT image reconstructions of the cervical spine were  also generated. COMPARISON:  None. 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 Old left occipital infarction is noted. Mild chronic ischemic white matter disease is noted. No mass effect or midline shift is noted. Ventricular size is within normal limits. There is no evidence of mass lesion, hemorrhage or acute infarction. Vascular: No hyperdense vessel or unexpected calcification. Skull: Normal. Negative for fracture or focal lesion. Sinuses/Orbits: No acute finding. Large metallic density is seen in the soft tissues anterior to the left globe of unknown etiology. Other: None. CT CERVICAL SPINE FINDINGS Alignment: Minimal grade 1 anterolisthesis of C4-5 is noted secondary to posterior facet joint hypertrophy. Skull base and vertebrae: No acute fracture. No primary bone lesion or focal pathologic process. Soft tissues and spinal canal: No prevertebral fluid or swelling. No visible canal hematoma. Disc levels: Severe degenerative disc disease is noted at C5-6 and C6-7 with anterior posterior osteophyte formation. Upper chest: Negative. Other: There appears to be fusion of the posterior elements of C2, C3 and C4. Significant degenerative changes seen involving the left-sided posterior facet joint of C4-5. IMPRESSION: Old right posterior parietal and left occipital  infarctions. Mild chronic ischemic white matter disease. No acute intracranial abnormality seen. Large metallic density is seen in the soft tissues anterior to the left lobe of unknown etiology; clinical correlation is recommended. Severe multilevel degenerative disc disease. No acute abnormality seen in the cervical spine. Electronically Signed   By: Lupita Raider M.D.   On: 01/30/2019 12:32   Ct Cervical Spine Wo Contrast  Result Date: 01/30/2019 CLINICAL DATA:  Head injury after falling down stairs today. EXAM: CT HEAD WITHOUT CONTRAST CT CERVICAL SPINE WITHOUT CONTRAST TECHNIQUE: Multidetector CT imaging of the head and cervical spine was performed following the standard protocol without intravenous contrast. Multiplanar CT image reconstructions of the cervical spine were also generated. COMPARISON:  None. 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 Old left occipital infarction is noted. Mild chronic ischemic white matter disease is noted. No mass effect or midline shift is noted. Ventricular size is within normal limits. There is no evidence of mass lesion, hemorrhage or acute infarction. Vascular: No hyperdense vessel or unexpected calcification. Skull: Normal. Negative for fracture or focal lesion. Sinuses/Orbits: No acute finding. Large metallic density is seen in the soft tissues anterior to the left globe of unknown etiology. Other: None. CT CERVICAL SPINE FINDINGS Alignment: Minimal grade 1 anterolisthesis of C4-5 is noted secondary to posterior facet joint hypertrophy. Skull base and vertebrae: No acute fracture. No primary bone lesion or focal pathologic process. Soft tissues and spinal canal: No prevertebral fluid or swelling. No visible canal hematoma. Disc levels: Severe degenerative disc disease is noted at C5-6 and C6-7 with anterior posterior osteophyte formation. Upper chest: Negative. Other: There appears to be fusion of the posterior elements of  C2, C3 and C4. Significant degenerative changes seen involving the left-sided posterior facet joint of C4-5. 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 No acute intracranial abnormality seen. Large metallic density is seen in the soft tissues anterior to the left lobe of unknown etiology; clinical correlation is recommended. Severe multilevel degenerative disc disease. No acute abnormality seen in the cervical spine. Electronically Signed   By: Lupita Raider M.D.   On: 01/30/2019 12:32   Dg Chest Portable 1 View  Result Date: 01/30/2019 CLINICAL DATA:  Preop. EXAM: PORTABLE CHEST 1 VIEW COMPARISON:  None.  FINDINGS: Mild cardiomegaly is noted. Old bilateral rib fractures are noted. No pneumothorax is noted. Mild bibasilar atelectasis is noted. Small left pleural effusion may be present. IMPRESSION: Mild bibasilar subsegmental atelectasis. Small left pleural effusion. Electronically Signed   By: Lupita Raider M.D.   On: 01/30/2019 15:57   Dg C-arm 1-60 Min-no Report  Result Date: 01/30/2019 Fluoroscopy was utilized by the requesting physician.  No radiographic interpretation.    Microbiology: Recent Results (from the past 240 hour(s))  SARS Coronavirus 2 [facility] order, Performed in [facility] hospital lab) Nasopharyngeal Nasopharyngeal Swab     Status: None   Collection Time: 01/30/19  1:27 PM   Specimen: Nasopharyngeal Swab  Result Value Ref Range Status   SARS Coronavirus 2 NEGATIVE NEGATIVE Final    Comment: (NOTE) If result is NEGATIVE SARS-CoV-2 target nucleic acids are NOT DETECTED. The SARS-CoV-2 RNA is generally detectable in upper and lower  respiratory specimens during the acute phase of infection. The lowest  concentration of SARS-CoV-2 viral copies this assay can detect is 250  copies / mL. A negative result does not preclude SARS-CoV-2 infection  and should not be used as the sole basis for treatment  or other  patient management decisions.  A negative result may occur with  improper specimen collection / handling, submission of specimen other  than nasopharyngeal swab, presence of viral mutation(s) within the  areas targeted by this assay, and inadequate number of viral copies  (<250 copies / mL). A negative result must be combined with clinical  observations, patient history, and epidemiological information. If result is POSITIVE SARS-CoV-2 target nucleic acids are DETECTED. The SARS-CoV-2 RNA is generally detectable in upper and lower  respiratory specimens dur ing the acute phase of infection.  Positive  results are indicative of active infection with SARS-CoV-2.  Clinical  correlation with patient history and other diagnostic information is  necessary to determine patient infection status.  Positive results do  not rule out bacterial infection or co-infection with other viruses. If result is PRESUMPTIVE POSTIVE SARS-CoV-2 nucleic acids MAY BE PRESENT.   A presumptive positive result was obtained on the submitted specimen  and confirmed on repeat testing.  While 2019 novel coronavirus  (SARS-CoV-2) nucleic acids may be present in the submitted sample  additional confirmatory testing may be necessary for epidemiological  and / or clinical management purposes  to differentiate between  SARS-CoV-2 and other Sarbecovirus currently known to infect humans.  If clinically indicated additional testing with an alternate test  methodology [phone]) is advised. The SARS-CoV-2 RNA is generally  detectable in upper and lower respiratory sp ecimens during the acute  phase of infection. The expected result is Negative. Fact Sheet for Patients:  BoilerBrush.com.cy Fact Sheet for Healthcare Providers: https://pope.com/ This test is not yet approved or cleared by the [place] FDA and has been authorized for detection and/or diagnosis of  SARS-CoV-2 by FDA under an Emergency Use Authorization (EUA).  This EUA will remain in effect (meaning this test can be used) for the duration of the COVID-19 declaration under Section 564(b)(1) of the Act, 21 U.S.C. section 360bbb-3(b)(1), unless the authorization is terminated or revoked sooner. Performed at [facility], [address], [place], [place] [address]   Surgical pcr screen     Status: None   Collection Time: 01/30/19  5:42 PM   Specimen: Nasal Mucosa; Nasal Swab  Result Value Ref Range Status   MRSA, PCR NEGATIVE NEGATIVE Final   Staphylococcus aureus  NEGATIVE NEGATIVE Final    Comment: (NOTE) The Xpert SA Assay (FDA approved for NASAL specimens in patients 70 years of age and older), is one component of a comprehensive surveillance program. It is not intended to diagnose infection nor to guide or monitor treatment. Performed at [facility], [address] [place], [place] [id number]      Labs: Basic Metabolic Panel: Recent Labs  Lab 01/30/19 1247 01/31/19 0224 02/01/19 0222  NA 144 141 140  K 3.9 3.7 4.3  CL 113* 106 104  CO2 22 23 26   GLUCOSE 93 117* 111*  BUN 15 12 16   CREATININE 0.73 0.61 0.74  CALCIUM 8.2* 7.8* 8.0*   Liver Function Tests: No results for input(s): AST, ALT, ALKPHOS, BILITOT, PROT, ALBUMIN in the last 168 hours. No results for input(s): LIPASE, AMYLASE in the last 168 hours. No results for input(s): AMMONIA in the last 168 hours. CBC: Recent Labs  Lab 01/30/19 1247 01/31/19 0224 02/01/19 0222 02/02/19 0402  WBC 5.5 6.3 6.6 5.3  NEUTROABS 4.3  --   --   --   HGB 10.6* 10.2* 9.2* 8.9*  HCT 34.1* 32.8* 29.9* 28.8*  MCV 105.6* 106.1* 107.6* 106.7*  PLT 202 203 180 178   Cardiac Enzymes: No results for input(s): CKTOTAL, CKMB, CKMBINDEX, TROPONINI in the last 168 hours. BNP: BNP (last 3 results) No results for input(s): BNP in the last 8760 hours.  ProBNP (last 3 results) No results  for input(s): PROBNP in the last 8760 hours.  CBG: No results for input(s): GLUCAP in the last 168 hours.     Signed:  Darlin Droparole N Ar[place] Freshour, MD [facility] 02/02/2019, 3:19 PM

## 2019-02-02 NOTE — Progress Notes (Signed)
Physical Therapy Treatment Patient Details Name: Laura [name] MRN: [id number] DOB: December 30, 1950 Today's Date: 02/02/2019    History of Present Illness 68 y.o. female with medical history significant of CAD status post PCI/stent, COPD, OSA on CPAP, CVA, Crohn's disease, HLD, GERD and admitted for right ankle fracture.  Pt s/p Right ankle ORIF 01/30/19.    PT Comments    Pt's daughter, who will be her caregiver, participated in hands-on transfer training with pt. Pt's daughter required min cuing to safely assist pt with power up from seated, steadying with use of gait belt, and overall did well in assisting pt. Pt requires min assist for mobility, and after this visit PT is confident pt's caregiver is able to provide this. Pt and pt's daughter decline stair training, states pt's daughter's husband and son will assist pt into house. Pt to d/c today.  Of note, CM has not delivered wheelchair or 3in1 upon PT exit, RN notified to contact case management to arrange this.     Follow Up Recommendations  SNF     Equipment Recommendations  Wheelchair cushion (measurements PT);Wheelchair (measurements PT);3in1 (PT)    Recommendations for Other Services       Precautions / Restrictions Precautions Precautions: Fall Restrictions Weight Bearing Restrictions: Yes RLE Weight Bearing: Non weight bearing    Mobility  Bed Mobility Overal bed mobility: Needs Assistance Bed Mobility: Supine to Sit     Supine to sit: Min assist;HOB elevated Sit to supine: Min assist;HOB elevated   General bed mobility comments: Min assist for trunk elevation, performed by pt's daughter.  Transfers Overall transfer level: Needs assistance Equipment used: Rolling walker (2 wheeled) Transfers: Sit to/from [facility] Sit to Stand: Min assist;From elevated surface Stand pivot transfers: Min assist;From elevated surface       General transfer comment: Performed by pt's daughter with close  supervision by PT. Min assist for power up, steadying, transfer to recliner towards pt's L side. PT with cuing for proper placement of caregiver's hand on pt's gait belt to assist in rise and steadying, proper placement of recliner at bedside, and ensuring destination surface is locked and safe.  Ambulation/Gait Ambulation/Gait assistance: (NT)               Stairs             Wheelchair Mobility    Modified Rankin (Stroke Patients Only)       Balance Overall balance assessment: History of Falls;Needs assistance Sitting-balance support: No upper extremity supported;Feet supported Sitting balance-Leahy Scale: Fair     Standing balance support: Bilateral upper extremity supported Standing balance-Leahy Scale: Poor                              Cognition Arousal/Alertness: Awake/alert Behavior During Therapy: WFL for tasks assessed/performed;Anxious Overall Cognitive Status: Within Functional Limits for tasks assessed                                 General Comments: anxiety with mobility, very scared of falling in standing      Exercises      General Comments General comments (skin integrity, edema, etc.): dyspnea 2/4 on RA      Pertinent Vitals/Pain Pain Assessment: Faces Faces Pain Scale: Hurts little more Pain Location: R LE Pain Descriptors / Indicators: Discomfort;Grimacing Pain Intervention(s): Limited activity within patient's tolerance;Monitored during session;Premedicated before session;Repositioned  Home Living                      Prior Function            PT Goals (current goals can now be found in the care plan section) Acute Rehab PT Goals Patient Stated Goal: home asap PT Goal Formulation: With patient/family Time For Goal Achievement: 02/07/19 Potential to Achieve Goals: Good Progress towards PT goals: Progressing toward goals    Frequency    Min 3X/week      PT Plan Current plan  remains appropriate    Co-evaluation              AM-PAC PT "6 Clicks" Mobility   Outcome Measure  Help needed turning from your back to your side while in a flat bed without using bedrails?: A Little Help needed moving from lying on your back to sitting on the side of a flat bed without using bedrails?: A Little Help needed moving to and from a bed to a chair (including a wheelchair)?: A Lot Help needed standing up from a chair using your arms (e.g., wheelchair or bedside chair)?: A Lot Help needed to walk in hospital room?: Total Help needed climbing 3-5 steps with a railing? : Total 6 Click Score: 12    End of Session Equipment Utilized During Treatment: Gait belt;Oxygen Activity Tolerance: Patient limited by fatigue;Other (comment)(nausea, diaphoresis) Patient left: in bed;with bed alarm set;with call bell/phone within reach Nurse Communication: Mobility status PT Visit Diagnosis: Other abnormalities of gait and mobility (R26.89)     Time: 1100-1108 PT Time Calculation (min) (ACUTE ONLY): 8 min  Charges:  $Therapeutic Activity: 8-22 mins                    Nimsi Males Terrial Rhodes, PT Acute Rehabilitation Services Pager [phone]  Office [phone]   Macaria Bias D Tanara Turvey 02/02/2019, 1:26 PM

## 2019-02-02 NOTE — Progress Notes (Signed)
Physical Therapy Treatment Patient Details Name: Laura [name] MRN: [id number] DOB: 25-Dec-1950 Today's Date: 02/02/2019    History of Present Illness 68 y.o. female with medical history significant of CAD status post PCI/stent, COPD, OSA on CPAP, CVA, Crohn's disease, HLD, GERD and admitted for right ankle fracture.  Pt s/p Right ankle ORIF 01/30/19.    PT Comments    Pt with improved bed mobility and transfer skills today, transferring to and from [facility] with min assist. Pt limited by fatigue, nausea, and diaphoresis, BP and HR 163/90 and 78 bpm respectively. Pt requesting PT come back in ~45 minutes for caregiver training for pt's daughter, pt is too fatigued to do any more at this time and daughter is arriving shortly.     Follow Up Recommendations  SNF     Equipment Recommendations  Wheelchair cushion (measurements PT);Wheelchair (measurements PT);3in1 (PT)    Recommendations for Other Services       Precautions / Restrictions Precautions Precautions: Fall Restrictions Weight Bearing Restrictions: Yes RLE Weight Bearing: Non weight bearing    Mobility  Bed Mobility Overal bed mobility: Needs Assistance Bed Mobility: Supine to Sit;Sit to Supine     Supine to sit: Min assist;HOB elevated Sit to supine: Min assist;HOB elevated   General bed mobility comments: Min assist for supine<>sit for LE lifting and translation to and from EOB, pt educated on using LLE under RLE at ankle to assist in lifting LEs into bed.  Transfers Overall transfer level: Needs assistance Equipment used: Rolling walker (2 wheeled) Transfers: Sit to/from [employer] Sit to Stand: Min assist;From elevated surface Stand pivot transfers: Min assist;From elevated surface       General transfer comment: Sit to stand x2 during session, once from bed and once from [facility]. Min assist for power up, steadying. Verbal cuing for hand placement when rising. Min assist for stand pivot to [facility] for  steadying, guiding pt to destination surface, slow eccentric lowering with use of gait belt.  Ambulation/Gait Ambulation/Gait assistance: (NT)               Stairs             Wheelchair Mobility    Modified Rankin (Stroke Patients Only)       Balance Overall balance assessment: History of Falls;Needs assistance Sitting-balance support: No upper extremity supported;Feet supported Sitting balance-Leahy Scale: Fair     Standing balance support: Bilateral upper extremity supported Standing balance-Leahy Scale: Poor                              Cognition Arousal/Alertness: Awake/alert Behavior During Therapy: WFL for tasks assessed/performed;Anxious Overall Cognitive Status: Within Functional Limits for tasks assessed                                 General Comments: anxiety with mobility, very scared of falling in standing      Exercises      General Comments General comments (skin integrity, edema, etc.): dyspnea 2/4 on RA      Pertinent Vitals/Pain Pain Assessment: Faces Faces Pain Scale: Hurts little more Pain Location: R LE Pain Descriptors / Indicators: Discomfort;Grimacing Pain Intervention(s): Limited activity within patient's tolerance;Monitored during session;Premedicated before session;Repositioned    Home Living                      Prior Function  PT Goals (current goals can now be found in the care plan section) Acute Rehab PT Goals Patient Stated Goal: home asap PT Goal Formulation: With patient/family Time For Goal Achievement: 02/07/19 Potential to Achieve Goals: Good Progress towards PT goals: Progressing toward goals    Frequency    Min 3X/week      PT Plan Current plan remains appropriate    Co-evaluation              AM-PAC PT "6 Clicks" Mobility   Outcome Measure  Help needed turning from your back to your side while in a flat bed without using bedrails?: A  Little Help needed moving from lying on your back to sitting on the side of a flat bed without using bedrails?: A Little Help needed moving to and from a bed to a chair (including a wheelchair)?: A Lot Help needed standing up from a chair using your arms (e.g., wheelchair or bedside chair)?: A Lot Help needed to walk in hospital room?: Total Help needed climbing 3-5 steps with a railing? : Total 6 Click Score: 12    End of Session Equipment Utilized During Treatment: Gait belt;Oxygen Activity Tolerance: Patient limited by fatigue;Other (comment)(nausea, diaphoresis) Patient left: in bed;with bed alarm set;with call bell/phone within reach Nurse Communication: Mobility status PT Visit Diagnosis: Other abnormalities of gait and mobility (R26.89)     Time: 2751-7001 PT Time Calculation (min) (ACUTE ONLY): 24 min  Charges:  $Therapeutic Activity: 8-22 mins                    [name] Conception Chancy, PT Acute Rehabilitation Services Pager [phone]  Office [phone]    [name] 02/02/2019, 1:18 PM

## 2019-02-02 NOTE — TOC Progression Note (Signed)
Transition of Care [facility]) - Progression Note    Patient Details  Name: Laura [name] MRN: [id number] Date of Birth: 10/17/1950  [facility]) CM/SW Contact  Iretta Mangrum, Juliann Pulse, RN Phone Number: 02/02/2019, 4:06 PM  Clinical Narrative:CM has provided [employer] dme rep Bertrum Sol w/ son in law John c# for arrangements for dme delivery in [place].       Expected Discharge Plan: [place] Barriers to Discharge: No Barriers Identified  Expected Discharge Plan and Services Expected Discharge Plan: [place]   Discharge Planning Services: CM Consult Post Acute Care Choice: Durable Medical Equipment, Home Health Living arrangements for the past 2 months: Single Family Home Expected Discharge Date: 02/02/19               DME Arranged: Hospital bed, Walker rolling, Wheelchair manual, 3-N-1 DME Agency: [employer] Date DME Agency Contacted: 02/02/19 Time DME Agency Contacted: [id number] Representative spoke with at DME Agency: [place]: PT, OT, Nurse's Aide [place] Agency: [place] Date [place]: 01/31/19 Time [place]: 0900 Representative spoke with at [place]: [place] ([place]) Interventions    Readmission Risk Interventions No flowsheet data found.

## 2019-02-02 NOTE — Progress Notes (Signed)
   Subjective: 3 Days Post-Op Procedure(s) (LRB): OPEN REDUCTION INTERNAL FIXATION (ORIF) ANKLE FRACTURE (Right)  Pt doing well this morning Ankle feels much better after ORIF Denies any new symptoms or issues Possible d/c today Patient reports pain as mild.  Objective:   VITALS:   Vitals:   02/01/19 2227 02/02/19 0600  BP: (!) 150/71 133/64  Pulse: 69 68  Resp: 16 16  Temp: 98.6 F (37 C) 98.4 F (36.9 C)  SpO2: 96% 95%    Right ankle: currently in splint nv intact distally No signs of edema or drainage  LABS Recent Labs    01/31/19 0224 02/01/19 0222 02/02/19 0402  HGB 10.2* 9.2* 8.9*  HCT 32.8* 29.9* 28.8*  WBC 6.3 6.6 5.3  PLT 203 180 178    Recent Labs    01/30/19 1247 01/31/19 0224 02/01/19 0222  NA 144 141 140  K 3.9 3.7 4.3  BUN 15 12 16   CREATININE 0.73 0.61 0.74  GLUCOSE 93 117* 111*     Assessment/Plan: 3 Days Post-Op Procedure(s) (LRB): OPEN REDUCTION INTERNAL FIXATION (ORIF) ANKLE FRACTURE (Right) Pt stable from orthopedic standpoint for d/c home Non weight bearing right lower extremity F/u in the office in 2 weeks Pt in agreement     Laura Cinderella Christoffersen PA-C, [place] is now [employer] [address] [place], [place], [place] [address] Phone: [phone] [email or web address] Facebook  [employer]

## 2019-02-02 NOTE — TOC Transition Note (Signed)
Transition of Care [facility]) - CM/SW Discharge Note   Patient Details  Name: Laura [name] MRN: [id number] Date of Birth: 1950-05-11  [facility]) CM/SW Contact:  Dessa Phi, RN Phone Number: 02/02/2019, 2:47 PM   Clinical Narrative:[employer] providing dme to deliver to home in [place]-they will contact dtr Wells Guiles.       Final next level of care: [place] Barriers to Discharge: No Barriers Identified   Patient Goals and CMS Choice     Choice offered to / list presented to : Adult Children  Discharge Placement                       Discharge Plan and Services   Discharge Planning Services: CM Consult Post Acute Care Choice: Durable Medical Equipment, Home Health          DME Arranged: Hospital bed, Walker rolling, Wheelchair manual, 3-N-1 DME Agency: [employer] Date DME Agency Contacted: 02/02/19 Time DME Agency Contacted: [phone] Representative spoke with at DME Agency: [place]: PT, OT, Nurse's Aide [place] Agency: [place] Date [place]: 01/31/19 Time [place]: 0900 Representative spoke with at [place]: [place] ([place]) Interventions     Readmission Risk Interventions No flowsheet data found.

## 2019-02-02 NOTE — Progress Notes (Signed)
Pt has refused CPAP for the night, machine remains at bedside.  RT to monitor and assess as needed.  

## 2019-02-02 NOTE — TOC Progression Note (Signed)
Transition of Care [facility]) - Progression Note    Patient Details  Name: Laura [name] MRN: [id number] Date of Birth: 07/11/1950  Transition of Care [facility]) CM/SW Contact  Montreal Steidle, Juliann Pulse, RN Phone Number: 02/02/2019, 1:03 PM  Clinical Narrative: Received call from nsg about dme-CM tried calling [employer] office & [place] office not open today. TC [employer] office-closed today.Contacted [employer]-rep [place] to asst w/dme-he must check if have an office in that area [place] [address]. DME needed & ordered hospital bed,w/c,3n1,rw to be delivered [place] [facility] [place] [address]-spoke to dtr Wells Guiles [phone] who will drive patient to home 3 & 1/2 hrs. Rebecca's spouse Charlotte Crumb is @ the home in [place] to receive the dme c#[phone]. MD updated.     Expected Discharge Plan: [place] Barriers to Discharge: No Barriers Identified  Expected Discharge Plan and Services Expected Discharge Plan: [place]   Discharge Planning Services: CM Consult Post Acute Care Choice: Durable Medical Equipment, Home Health Living arrangements for the past 2 months: Single Family Home Expected Discharge Date: 02/02/19               DME Arranged: Hospital bed, 3-N-1 DME Agency: Other - Comment Date DME Agency Contacted: 02/01/19 Time DME Agency Contacted: [phone] Representative spoke with at DME Agency: [place] HH Arranged: PT, OT, Nurse's Aide HH Agency: [place] Date [place]: 01/31/19 Time HH Agency Contacted: 0900 Representative spoke with at [place]: [place] ([place]) Interventions    Readmission Risk Interventions No flowsheet data found.

## 2019-02-03 MED ORDER — OXYCODONE HCL 5 MG PO TABS: 5.0000 mg | ORAL_TABLET | Freq: Four times a day (QID) | ORAL | 0 refills | Status: DC | PRN

## 2019-02-03 MED ORDER — OXYCODONE HCL 5 MG PO TABS: 5.0000 mg | ORAL_TABLET | Freq: Four times a day (QID) | ORAL | 0 refills | Status: AC | PRN

## 2019-02-03 NOTE — Discharge Summary (Signed)
Discharge Summary  [name] WJX:[id number] DOB: [birth year]  PCP: System, Pcp Not In  Admit date: 01/30/2019 Discharge date: 02/03/2019  Time spent: 25 minutes  Recommendations for Outpatient Follow-up:  1. Follow up with PCP in 1 week 2. Patient's home PCP to help with setting up orthopedics follow-up in hometown 3. Continue aspirin 81 mg p.o. twice daily for 30 days then may resume once daily dosing for postoperative DVT prophylaxis per orthopedics 4. Patient's home health orders faxed by case management to [facility] [place] 5. Continue nonweightbearing right lower extremity until follows up with orthopedics  PT recommends 3n1 bedside comode, wheelchair, walker, and hospital bed due to ambulatory dysfunction.    Patient suffers from ambulatory dysfunction which impairs their ability to perform daily activities like walking in the home.  A walking aid will not resolve issue with performing activities of daily living. A wheelchair will allow patient to safely perform daily activities. Patient can safely propel the wheelchair in the home or has a caregiver who can provide assistance. Length of need 6 months. Accessories: elevating leg rests, wheel locks, extensions and anti-tippers.   Home Health: Yes, PT Equipment/Devices: none  Discharge Condition: Stable CODE STATUS: Full code Diet recommendation: Heart Healthy   Discharge Diagnoses:  Active Hospital Problems   Diagnosis Date Noted   Closed displaced bimalleolar fracture of right ankle 01/30/2019   COPD (chronic obstructive pulmonary disease) (HCC) 01/30/2019   CAD (coronary artery disease) 01/30/2019   Crohn's disease (HCC) 01/30/2019   OSA on CPAP 01/30/2019   HLD (hyperlipidemia) 01/30/2019   PVD (peripheral vascular disease) (HCC) 01/30/2019   GERD (gastroesophageal reflux disease) 01/30/2019   Ankle fracture, right, closed, initial encounter 01/30/2019    Resolved Hospital  Problems  No resolved problems to display.    Vitals:   02/02/19 2122 02/03/19 0532  BP: (!) 178/81 (!) 158/71  Pulse: 76 67  Resp: 16 16  Temp: 97.8 F (36.6 C) 98.2 F (36.8 C)  SpO2: 93% 91%    History of present illness:  Laura [name] a 68 y.o.femalewith medical history significant ofCAD status post PCI/stent, COPD, OSA on CPAP,CVA,Crohn's disease, HLD, GERD who presents following fall with chief complaint of right ankle pain. Patient was apparently coming down some steps in which she slipped and fell this morning. She mainly reported severe right ankle discomfort. She denies syncopal episode and recalls the event in its entirety. She also reports recurrent nausea, believes it secondary to a medication side effect during the reduction of her ankle dislocation. No other complaints or concerns at this time. Specifically denies headache, no fever/chills/night sweats, no vomiting/diarrhea, no chest pain, palpitations, no shortness of breath, no abdominal pain.  In the ED, temperature 97.8, HR 61, RR 20, BP 130/58, SPO2 98% on 2 L nasal cannula(uses home oxygen prn).The BC count 5.5, hemoglobin 10.6, platelet count 202, sodium 144, potassium 3.9, chloride 113,CO2 22, BUN 15, creatinine 0.73, glucose 93. COVID-19/SARS-CoV-2 test negative. Right ankle x-ray notable for displaced medial malleolar fracture. CT head with old right posterior parietal/left occipital infarct, no acute abnormality. CT C-spine negative for acute fracture/subluxation. EKG with normal sinus rhythm, QTC 467, HR 58, LVH, no concerning ST elevation/depressions or T wave inversions. Patient's ankle dislocation was reduced by ED provider. Orthopedics, Dr. Ranell Patrick with [facility] consulted and plan operative intervention this evening. [facility] was consulted for admission.  02/02/19: Patient was seen and examined at her bedside this morning.  She wants to go home.  Cleared by  orthopedic surgery to be  discharged.  Will need to follow-up in 2 weeks as recommended by orthopedic surgery.  02/03/19: Patient seen and examined at her bedside this morning.  No acute events overnight.  She wants to go home, back to [place].  Appears discharge delayed yesterday pending arrangement with DME's in [place].  Asked if she had any other needs she replied no thank you.  She has no new complaints.  Hospital Course:  Principal Problem:   Closed displaced bimalleolar fracture of right ankle Active Problems:   COPD (chronic obstructive pulmonary disease) (HCC)   CAD (coronary artery disease)   Crohn's disease (HCC)   OSA on CPAP   HLD (hyperlipidemia)   PVD (peripheral vascular disease) (HCC)   GERD (gastroesophageal reflux disease)   Ankle fracture, right, closed, initial encounter  Closed displaced bimalleolar fracture of right anklewith dislocation Patient presenting with right ankle pain and foot deformity following mechanical fall earlier today. Denies any loss of consciousness and recalls the event in its entirety. X-ray right ankle with displaced bimalleolar fracture with subluxation, status post successful reduction by EDP.Patient underwent successful ORIF of right ankle bimalleolar fracture by Dr. Irving Copas 01/30/2019.  Patient to continue maintain splint in place and nonweightbearing to her right lower extremity.  Patient will return to her home in [place] to continue home health therapy.  Her PCP to arrange outpatient orthopedics follow-up for further instruction and monitoring of her fracture.  Patient continue aspirin 81 mg p.o. twice daily for 30 days then may resume once daily dosing.  Percocet as needed for severe pain.  CAD/MI s/p stent Peripheral vascular diseases/p LLE stent two months prior Essential hypertension Continue home metoprolol 12.5 mg p.o. twice daily, statin,Plavix,and aspirin (aspirin twice daily dosing for 30 days as above then may resume once daily  dose)  Crohn's disease,not in acute exacerbation --Continue Imuran 50 mg p.o. daily  GERD: Continue PPI  COPD Follows with pulmonary outpatient. Uses oxygen prn at home.  Continue home nebs/inhalers, outpatient follow-up with pulmonology as scheduled.  Hxischemic CVA,other nonhemorrhagic  CT head with notable old right posterior parietal and left occipital infarcts without acute findings. Continue aspirin and statin   Discharge Exam: BP (!) 158/71 (BP Location: Left Arm)    Pulse 67    Temp 98.2 F (36.8 C) (Oral)    Resp 16    Ht 5\' 9"  (1.753 m)    Wt 81.6 kg    SpO2 91%    BMI 26.58 kg/m   General: 68 y.o. year-old female well-developed well-nourished no acute distress.  Alert and oriented x3.    Cardiovascular: Regular rhythm no rubs or gallops no JVD or thyromegaly noted.    Respiratory: Clear to auscultation with no wheezes or rales.  Good inspiratory effort.  Abdomen: Soft nontender nondistended normal bowel sounds present.    Musculoskeletal: Right ankle in splint.  Psychiatry: Mood is appropriate for condition and setting.  Discharge Instructions You were cared for by a hospitalist during your hospital stay. If you have any questions about your discharge medications or the care you received while you were in the hospital after you are discharged, you can call the unit and asked to speak with the hospitalist on call if the hospitalist that took care of you is not available. Once you are discharged, your primary care physician will handle any further medical issues. Please note that NO REFILLS for any discharge medications will be authorized once you are discharged, as it is imperative that  you return to your primary care physician (or establish a relationship with a primary care physician if you do not have one) for your aftercare needs so that they can reassess your need for medications and monitor your lab values.  Discharge Instructions    Call MD for:   difficulty breathing, headache or visual disturbances   Complete by: As directed    Call MD for:  extreme fatigue   Complete by: As directed    Call MD for:  persistant dizziness or light-headedness   Complete by: As directed    Call MD for:  persistant nausea and vomiting   Complete by: As directed    Call MD for:  severe uncontrolled pain   Complete by: As directed    Call MD for:  temperature >100.4   Complete by: As directed    Diet - low sodium heart healthy   Complete by: As directed    Increase activity slowly   Complete by: As directed    No weight bearing to right lower extremity until follows up with orthopedics   Non weight bearing   Complete by: As directed    Right LE   Other Restrictions   Complete by: As directed    No weight bearing to right leg until follows up with orthopedics     Allergies as of 02/03/2019   No Known Allergies     Medication List    TAKE these medications   alendronate 70 MG tablet Commonly known as: FOSAMAX Take 70 mg by mouth once a week. Notes to patient: Take as prescribed   aspirin EC 81 MG tablet Take 1 tablet (81 mg total) by mouth 2 (two) times daily. Take for 4 weeks then resume once daily What changed:   when to take this  additional instructions   atorvastatin 80 MG tablet Commonly known as: LIPITOR Take 80 mg by mouth at bedtime.   azaTHIOprine 50 MG tablet Commonly known as: IMURAN Take 50 mg by mouth daily.   budesonide 0.5 MG/2ML nebulizer solution Commonly known as: PULMICORT Take 0.5 mg by nebulization 2 (two) times daily.   clopidogrel 75 MG tablet Commonly known as: PLAVIX Take 75 mg by mouth daily.   ipratropium-albuterol 0.5-2.5 (3) MG/3ML Soln Commonly known as: DUONEB Inhale 3 mLs into the lungs every 6 (six) hours as needed for shortness of breath.   metoprolol tartrate 25 MG tablet Commonly known as: LOPRESSOR Take 12.5 mg by mouth 2 (two) times daily.   ondansetron 4 MG disintegrating  tablet Commonly known as: ZOFRAN-ODT Take 4 mg by mouth every 4 (four) hours as needed for nausea/vomiting.   oxyCODONE 5 MG immediate release tablet Commonly known as: Oxy IR/ROXICODONE Take 1-2 tablets (5-10 mg total) by mouth every 6 (six) hours as needed for moderate pain or severe pain.   pantoprazole 40 MG tablet Commonly known as: PROTONIX Take 40 mg by mouth daily.   senna 8.6 MG Tabs tablet Commonly known as: SENOKOT Take 2 tablets (17.2 mg total) by mouth at bedtime.            Durable Medical Equipment  (From admission, onward)         Start     Ordered   02/02/19 1455  For home use only DME standard manual wheelchair with seat cushion  Once    Comments: Patient suffers from ambulatory dysfunction which impairs their ability to perform daily activities like walking in the home.  A walking aid will  not resolve issue with performing activities of daily living. A wheelchair will allow patient to safely perform daily activities. Patient can safely propel the wheelchair in the home or has a caregiver who can provide assistance. Length of need 6 months. Accessories: elevating leg rests, wheel locks, extensions and anti-tippers.   02/02/19 1455   02/02/19 1440  For home use only DME Walker rolling  Once    Question:  Patient needs a walker to treat with the following condition  Answer:  Unsteady gait   02/02/19 1439   01/31/19 1149  For home use only DME 3 n 1  Once     01/31/19 1148   01/31/19 1147  For home use only DME Hospital bed  Once    Question Answer Comment  Length of Need 6 Months   Patient has (list medical condition): Right ankle fracture/dislocation, s/p surgery, severe mobility impairment and high fall risk   The above medical condition requires: Patient requires the ability to reposition frequently   Head must be elevated greater than: 30 degrees   Bed type Semi-electric      01/31/19 1148           Discharge Care Instructions  (From admission,  onward)         Start     Ordered   02/03/19 0000  Non weight bearing    Comments: Right LE   02/03/19 [id number]         No Known Allergies Follow-up Information    Beverely LowNorris, Steve, MD. Call in 2 weeks.   Specialty: Orthopedic Surgery Why: [phone] Contact information: [address] [place] [id number] [phone]            The results of significant diagnostics from this hospitalization (including imaging, microbiology, ancillary and laboratory) are listed below for reference.    Significant Diagnostic Studies: Dg Ankle 2 Views Right  Result Date: 01/30/2019 CLINICAL DATA:  Post reduction. EXAM: RIGHT ANKLE - 2 VIEW COMPARISON:  Right ankle x-rays from same day. FINDINGS: Interval reduction of the bimalleolar fracture. No residual tibiotalar subluxation. The medial malleolar fracture is anatomic in alignment. There is some residual posterior displacement of the distal fibular fracture. The posterior malleolus is not well evaluated due to overlapping structures and cast material. The talar dome appears intact. Joint spaces are preserved. Osteopenia. Diffuse soft tissue swelling about the ankle. IMPRESSION: Successful reduction of the bimalleolar ankle fracture subluxation. Electronically Signed   By: Obie DredgeWilliam T Derry M.D.   On: 01/30/2019 15:50   Dg Ankle Complete Right  Result Date: 01/30/2019 CLINICAL DATA:  ORIF of right ankle fracture EXAM: RIGHT ANKLE - COMPLETE 3+ VIEW COMPARISON:  Films from earlier in the same day. FLUOROSCOPY TIME:  Radiation Exposure Index (as provided by the fluoroscopic device): Not available If the device does not provide the exposure index: Fluoroscopy Time:  19 seconds Number of Acquired Images:  6 FINDINGS: Two fixation screws are now seen traversing the medial malleolar fracture. A fixation sideplate is now seen along the distal fibula with multiple fixation screws in place. IMPRESSION: Status post ORIF of the right ankle  fractures. Electronically Signed   By: Alcide CleverMark  Lukens M.D.   On: 01/30/2019 21:56   Dg Ankle Complete Right  Result Date: 01/30/2019 CLINICAL DATA:  Fall down stairs today with right ankle injury. EXAM: RIGHT ANKLE - COMPLETE 3+ VIEW COMPARISON:  None. FINDINGS: Examination demonstrates moderate anteromedial subluxation of the tibia on the talus. There is a  displaced transverse fracture of the medial malleolus. There is a displaced oblique fracture of the distal fibular metaphysis. Possible fracture along the posterolateral aspect of the distal tibia. Remainder the exam is unremarkable. IMPRESSION: Moderate subluxation anteromedially of the tibia on the talus. Displaced medial malleolar fracture and distal fibular fracture. Possible fracture along the posterolateral aspect of the distal tibia. Electronically Signed   By: Elberta Fortis M.D.   On: 01/30/2019 12:42   Ct Head Wo Contrast  Result Date: 01/30/2019 CLINICAL DATA:  Head injury after falling down stairs today. EXAM: CT HEAD WITHOUT CONTRAST CT CERVICAL SPINE WITHOUT CONTRAST TECHNIQUE: Multidetector CT imaging of the head and cervical spine was performed following the standard protocol without intravenous contrast. Multiplanar CT image reconstructions of the cervical spine were also generated. COMPARISON:  None. 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 Old left occipital infarction is noted. Mild chronic ischemic white matter disease is noted. No mass effect or midline shift is noted. Ventricular size is within normal limits. There is no evidence of mass lesion, hemorrhage or acute infarction. Vascular: No hyperdense vessel or unexpected calcification. Skull: Normal. Negative for fracture or focal lesion. Sinuses/Orbits: No acute finding. Large metallic density is seen in the soft tissues anterior to the left globe of unknown etiology. Other: None. CT CERVICAL SPINE FINDINGS Alignment: Minimal grade 1  anterolisthesis of C4-5 is noted secondary to posterior facet joint hypertrophy. Skull base and vertebrae: No acute fracture. No primary bone lesion or focal pathologic process. Soft tissues and spinal canal: No prevertebral fluid or swelling. No visible canal hematoma. Disc levels: Severe degenerative disc disease is noted at C5-6 and C6-7 with anterior posterior osteophyte formation. Upper chest: Negative. Other: There appears to be fusion of the posterior elements of C2, C3 and C4. Significant degenerative changes seen involving the left-sided posterior facet joint of C4-5. 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 No acute intracranial abnormality seen. Large metallic density is seen in the soft tissues anterior to the left lobe of unknown etiology; clinical correlation is recommended. Severe multilevel degenerative disc disease. No acute abnormality seen in the cervical spine. Electronically Signed   By: Lupita Raider M.D.   On: 01/30/2019 12:32   Ct Cervical Spine Wo Contrast  Result Date: 01/30/2019 CLINICAL DATA:  Head injury after falling down stairs today. EXAM: CT HEAD WITHOUT CONTRAST CT CERVICAL SPINE WITHOUT CONTRAST TECHNIQUE: Multidetector CT imaging of the head and cervical spine was performed following the standard protocol without intravenous contrast. Multiplanar CT image reconstructions of the cervical spine were also generated. COMPARISON:  None. 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 Old left occipital infarction is noted. Mild chronic ischemic white matter disease is noted. No mass effect or midline shift is noted. Ventricular size is within normal limits. There is no evidence of mass lesion, hemorrhage or acute infarction. Vascular: No hyperdense vessel or unexpected calcification. Skull: Normal. Negative for fracture or focal lesion. Sinuses/Orbits: No acute finding.  Large metallic density is seen in the soft tissues anterior to the left globe of unknown etiology. Other: None. CT CERVICAL SPINE FINDINGS Alignment: Minimal grade 1 anterolisthesis of C4-5 is noted secondary to posterior facet joint hypertrophy. Skull base and vertebrae: No acute fracture. No primary bone lesion or focal pathologic process. Soft tissues and spinal canal: No prevertebral fluid or swelling. No visible canal hematoma. Disc levels: Severe degenerative disc disease  is noted at C5-6 and C6-7 with anterior posterior osteophyte formation. Upper chest: Negative. Other: There appears to be fusion of the posterior elements of C2, C3 and C4. Significant degenerative changes seen involving the left-sided posterior facet joint of C4-5. 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 No acute intracranial abnormality seen. Large metallic density is seen in the soft tissues anterior to the left lobe of unknown etiology; clinical correlation is recommended. Severe multilevel degenerative disc disease. No acute abnormality seen in the cervical spine. Electronically Signed   By: Lupita RaiderJames  Green Jr M.D.   On: 01/30/2019 12:32   Dg Chest Portable 1 View  Result Date: 01/30/2019 CLINICAL DATA:  Preop. EXAM: PORTABLE CHEST 1 VIEW COMPARISON:  None. FINDINGS: Mild cardiomegaly is noted. Old bilateral rib fractures are noted. No pneumothorax is noted. Mild bibasilar atelectasis is noted. Small left pleural effusion may be present. IMPRESSION: Mild bibasilar subsegmental atelectasis. Small left pleural effusion. Electronically Signed   By: Lupita RaiderJames  Green Jr M.D.   On: 01/30/2019 15:57   Dg C-arm 1-60 Min-no Report  Result Date: 01/30/2019 Fluoroscopy was utilized by the requesting physician.  No radiographic interpretation.    Microbiology: Recent Results (from the past 240 hour(s))  SARS Coronavirus 2 [facility](Hospital order, Performed [facility] lab) Nasopharyngeal Nasopharyngeal Swab     Status: None   Collection Time: 01/30/19  1:27 PM   Specimen: Nasopharyngeal Swab  Result Value Ref Range Status   SARS Coronavirus 2 NEGATIVE NEGATIVE Final    Comment: (NOTE) If result is NEGATIVE SARS-CoV-2 target nucleic acids are NOT DETECTED. The SARS-CoV-2 RNA is generally detectable in upper and lower  respiratory specimens during the acute phase of infection. The lowest  concentration of SARS-CoV-2 viral copies this assay can detect is 250  copies / mL. A negative result does not preclude SARS-CoV-2 infection  and should not be used as the sole basis for treatment or other  patient management decisions.  A negative result may occur with  improper specimen collection / handling, submission of specimen other  than nasopharyngeal swab, presence of viral mutation(s) within the  areas targeted by this assay, and inadequate number of viral copies  (<250 copies / mL). A negative result must be combined with clinical  observations, patient history, and epidemiological information. If result is POSITIVE SARS-CoV-2 target nucleic acids are DETECTED. The SARS-CoV-2 RNA is generally detectable in upper and lower  respiratory specimens dur ing the acute phase of infection.  Positive  results are indicative of active infection with SARS-CoV-2.  Clinical  correlation with patient history and other diagnostic information is  necessary to determine patient infection status.  Positive results do  not rule out bacterial infection or co-infection with other viruses. If result is PRESUMPTIVE POSTIVE SARS-CoV-2 nucleic acids MAY BE PRESENT.   A presumptive positive result was obtained on the submitted specimen  and confirmed on repeat testing.  While 2019 novel coronavirus  (SARS-CoV-2) nucleic acids may be present in the submitted sample  additional confirmatory testing may be necessary for epidemiological  and / or clinical management  purposes  to differentiate between  SARS-CoV-2 and other Sarbecovirus currently known to infect humans.  If clinically indicated additional testing with an alternate test  methodology [phone] is advised. The SARS-CoV-2 RNA is generally  detectable in upper and lower respiratory sp ecimens during the acute  phase of infection. The expected result is Negative. Fact Sheet for Patients:  BoilerBrush.com.cy Fact Sheet for Healthcare Providers: https://pope.com/ This test is not yet approved or cleared by the [place] FDA and has been authorized for detection and/or diagnosis of SARS-CoV-2 by FDA under an Emergency Use Authorization (EUA).  This EUA will remain in effect (meaning this test can be used) for the duration of the COVID-19 declaration under Section 564(b)(1) of the Act, 21 U.S.C. section 360bbb-3(b)(1), unless the authorization is terminated or revoked sooner. Performed at [facility], [address] [place], [place] [address]   Surgical pcr screen     Status: None   Collection Time: 01/30/19  5:42 PM   Specimen: Nasal Mucosa; Nasal Swab  Result Value Ref Range Status   MRSA, PCR NEGATIVE NEGATIVE Final   Staphylococcus aureus NEGATIVE NEGATIVE Final    Comment: (NOTE) The Xpert SA Assay (FDA approved for NASAL specimens in patients 36 years of age and older), is one component of a comprehensive surveillance program. It is not intended to diagnose infection nor to guide or monitor treatment. Performed at [facility], [address], [place], [place] [address]      Labs: Basic Metabolic Panel: Recent Labs  Lab 01/30/19 1247 01/31/19 0224 02/01/19 0222  NA 144 141 140  K 3.9 3.7 4.3  CL 113* 106 104  CO2 22 23 26   GLUCOSE 93 117* 111*  BUN 15 12 16   CREATININE 0.73 0.61 0.74  CALCIUM 8.2* 7.8* 8.0*   Liver Function Tests: No results for input(s): AST, ALT, ALKPHOS,  BILITOT, PROT, ALBUMIN in the last 168 hours. No results for input(s): LIPASE, AMYLASE in the last 168 hours. No results for input(s): AMMONIA in the last 168 hours. CBC: Recent Labs  Lab 01/30/19 1247 01/31/19 0224 02/01/19 0222 02/02/19 0402  WBC 5.5 6.3 6.6 5.3  NEUTROABS 4.3  --   --   --   HGB 10.6* 10.2* 9.2* 8.9*  HCT 34.1* 32.8* 29.9* 28.8*  MCV 105.6* 106.1* 107.6* 106.7*  PLT 202 203 180 178   Cardiac Enzymes: No results for input(s): CKTOTAL, CKMB, CKMBINDEX, TROPONINI in the last 168 hours. BNP: BNP (last 3 results) No results for input(s): BNP in the last 8760 hours.  ProBNP (last 3 results) No results for input(s): PROBNP in the last 8760 hours.  CBG: No results for input(s): GLUCAP in the last 168 hours.     Signed:  04/03/19, MD [facility] 02/03/2019, 10:38 AM

## 2021-04-13 IMAGING — DX DG CHEST 1V PORT
1 series · 1 of 1 positions shown · non-contrast
Comparison: None.

CLINICAL DATA: Preop.

EXAM:
PORTABLE CHEST 1 VIEW

[chest ap]
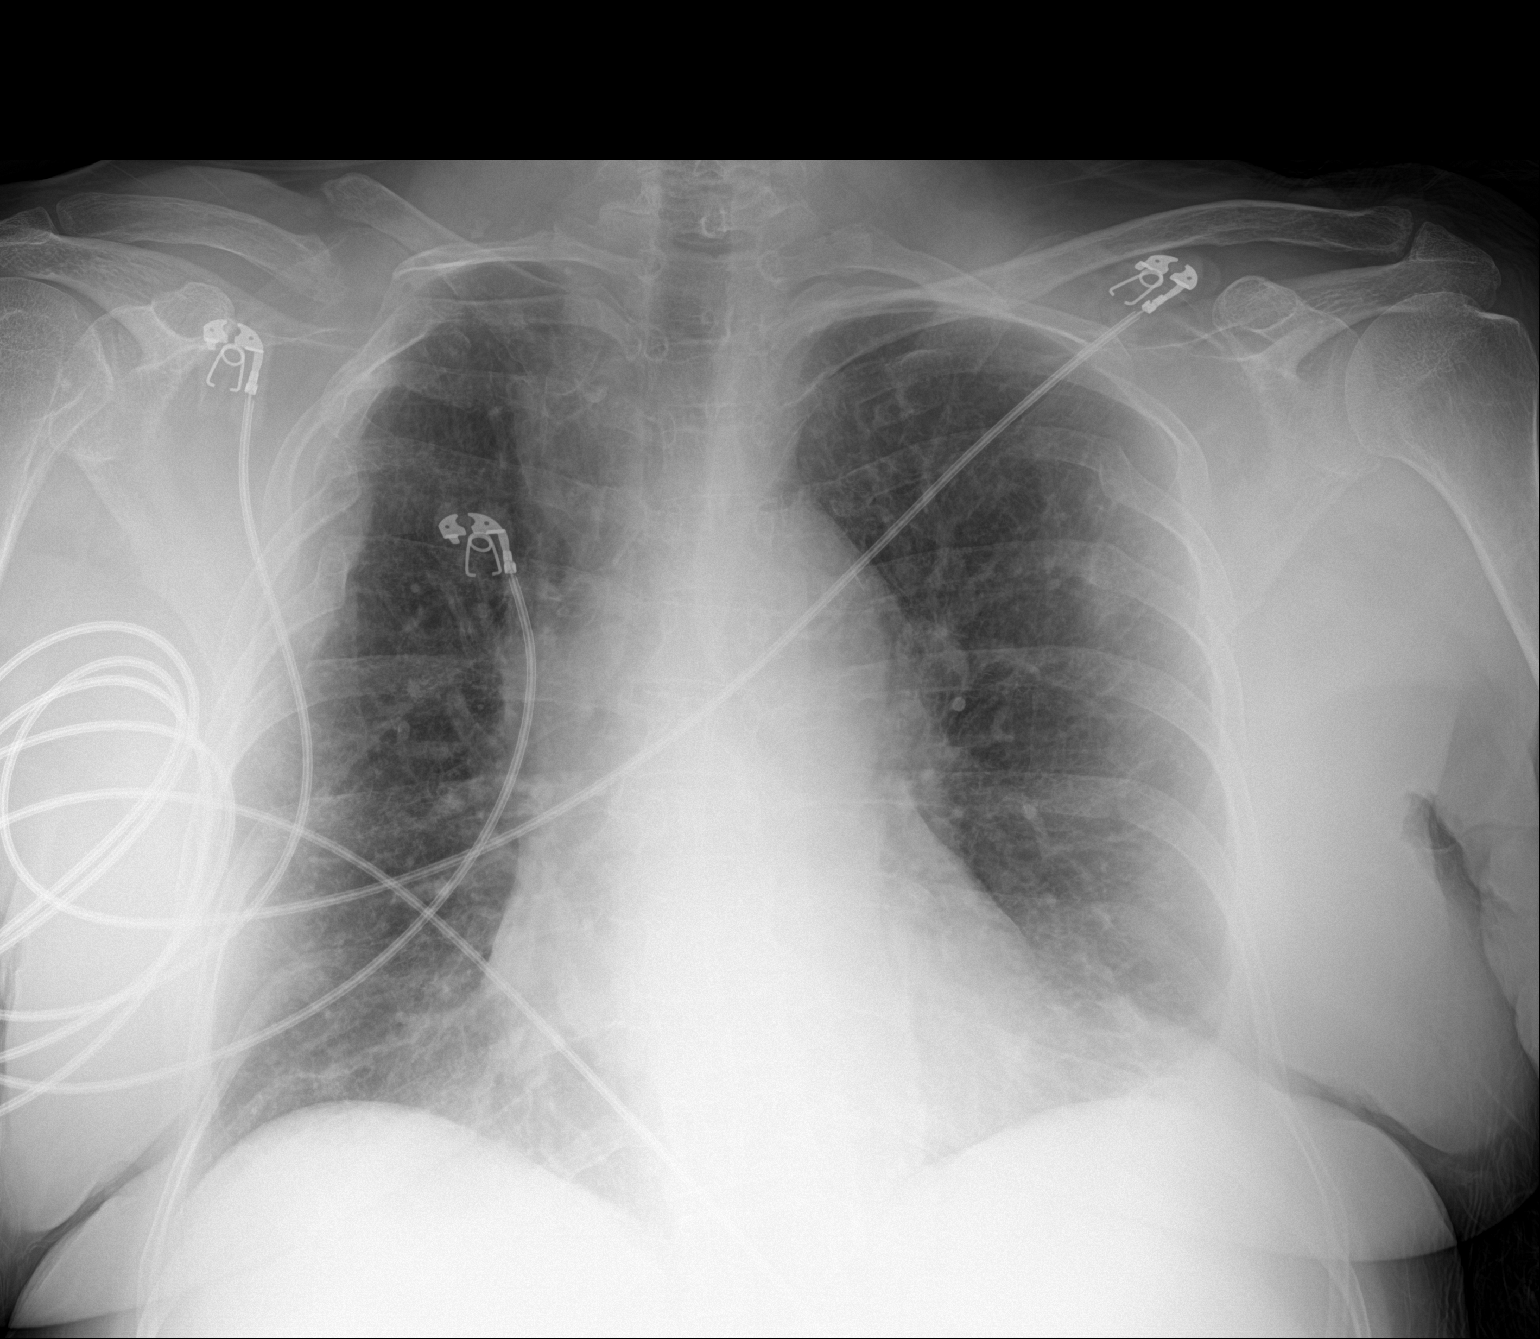

[1 of 1 positions shown; findings below may reference images not displayed]

FINDINGS: Mild cardiomegaly is noted. Old bilateral rib fractures are noted.
No pneumothorax is noted. Mild bibasilar atelectasis is noted. Small
left pleural effusion may be present.
IMPRESSION: Mild bibasilar subsegmental atelectasis. Small left pleural
effusion.

## 2021-04-13 IMAGING — DX DG ANKLE 2V *R*
2 series · 2 of 2 positions shown · non-contrast
Comparison: Right ankle x-rays from same day.

CLINICAL DATA: Post reduction.

EXAM:
RIGHT ANKLE - 2 VIEW

[ankle lat]
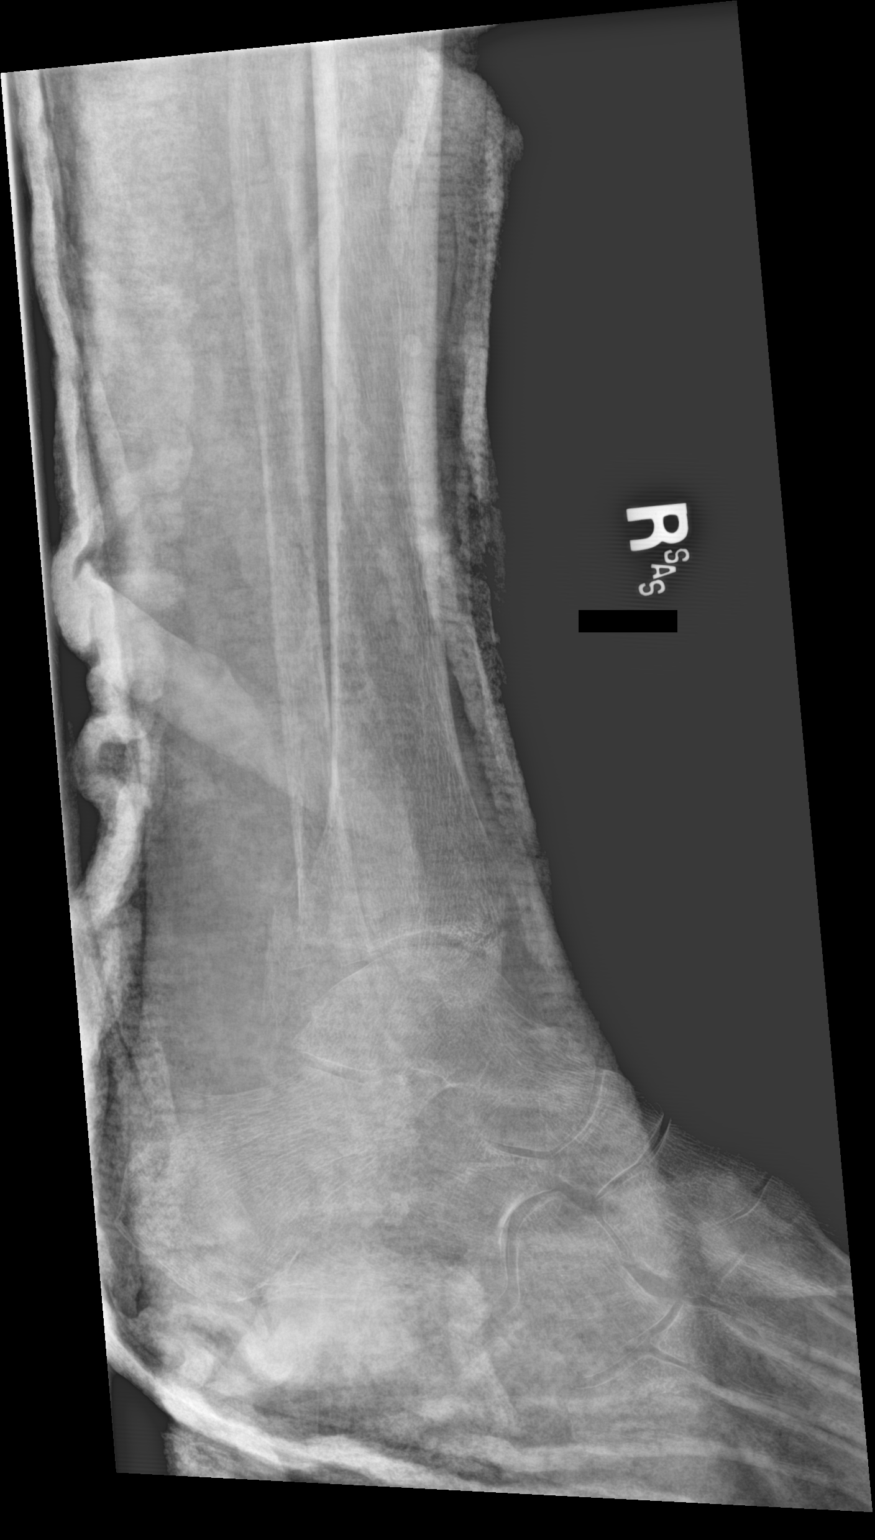

[ankle ap]
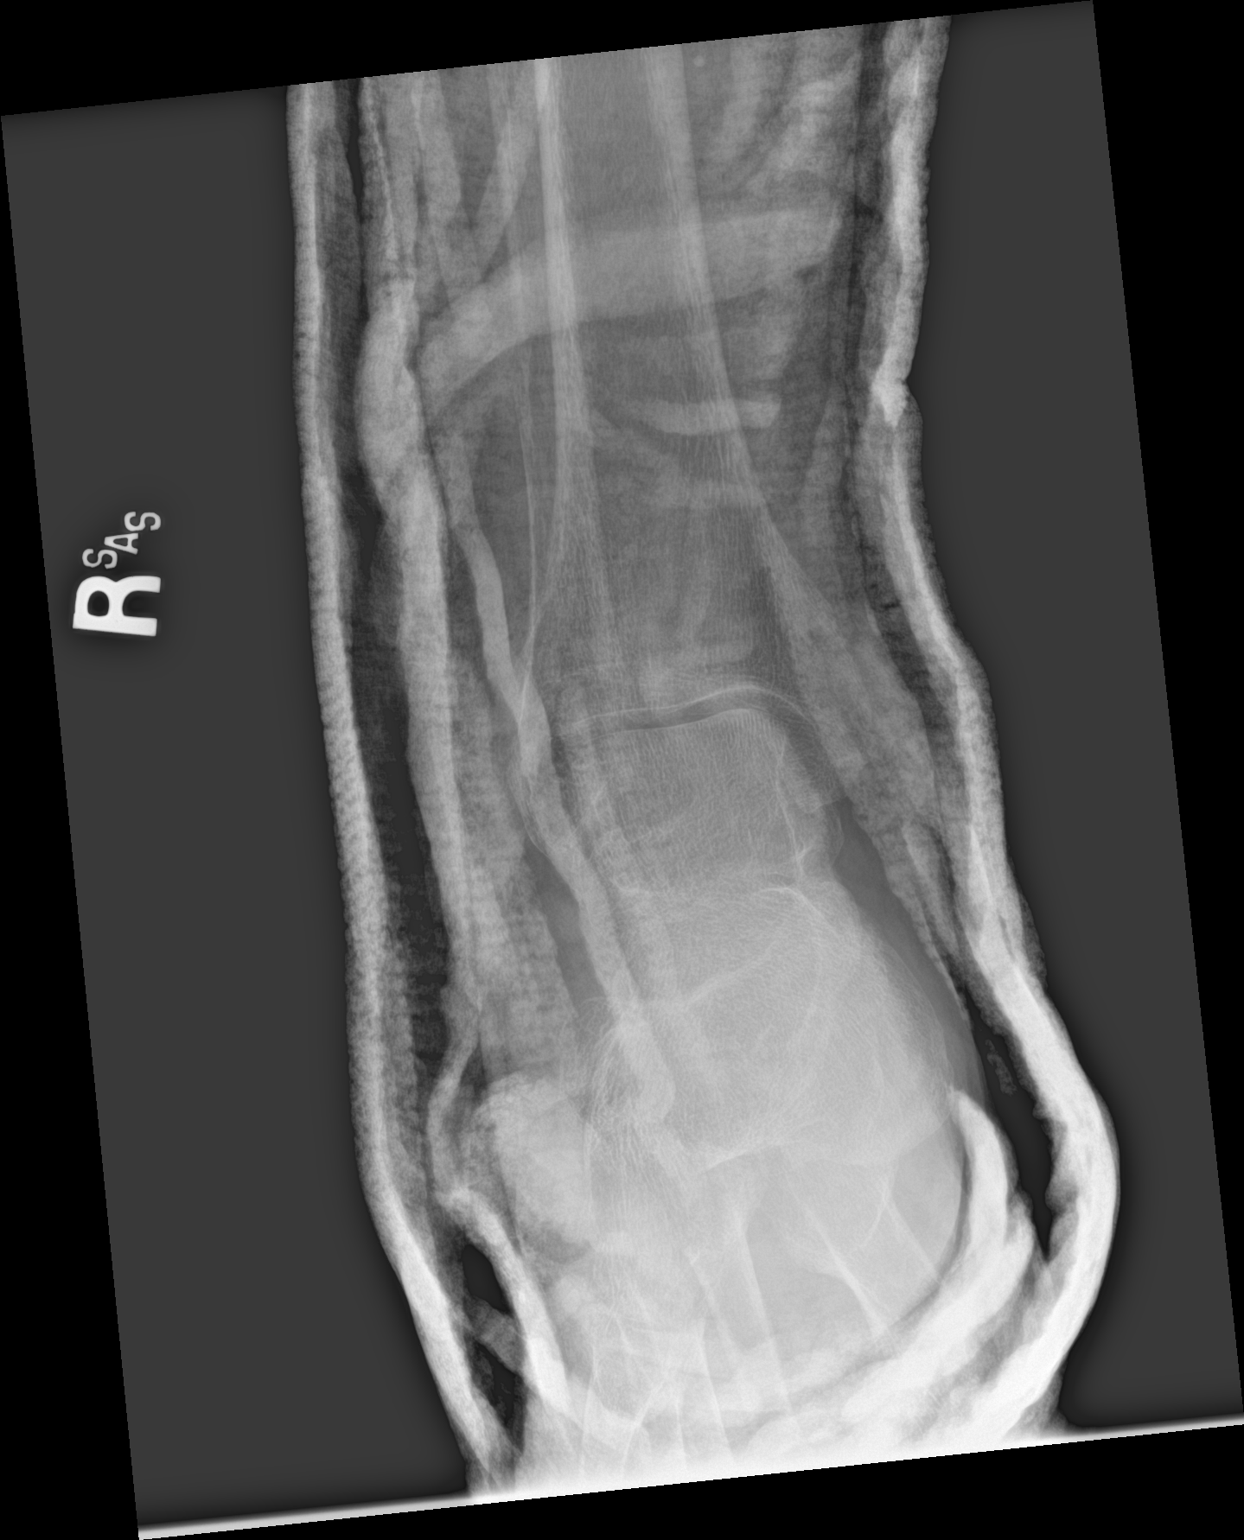

[2 of 2 positions shown; findings below may reference images not displayed]

FINDINGS: Interval reduction of the bimalleolar fracture. No residual
tibiotalar subluxation. The medial malleolar fracture is anatomic in
alignment. There is some residual posterior displacement of the
distal fibular fracture. The posterior malleolus is not well
evaluated due to overlapping structures and cast material. The talar
dome appears intact. Joint spaces are preserved. Osteopenia. Diffuse
soft tissue swelling about the ankle.
IMPRESSION: Successful reduction of the bimalleolar ankle fracture subluxation.
